# Patient Record
Sex: Female | Born: 1995 | Race: White | Hispanic: No | Smoking: Never smoker
Health system: Southern US, Community
[De-identification: ages and names within clinical notes are randomized; demographics above are authoritative.]

## PROBLEM LIST (undated history)

## (undated) DIAGNOSIS — J45909 Unspecified asthma, uncomplicated: Secondary | ICD-10-CM

## (undated) HISTORY — DX: Unspecified asthma, uncomplicated: J45.909

---
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
## 2016-10-12 ENCOUNTER — Ambulatory Visit: Payer: BLUE CROSS/BLUE SHIELD

## 2016-10-12 ENCOUNTER — Telehealth: Payer: BLUE CROSS/BLUE SHIELD

## 2016-10-12 MED ORDER — NORETHINDRONE ACET-ETHINYL EST 1-20 MG-MCG PO TABS
1 | ORAL_TABLET | Freq: Every day | ORAL | 11 refills | Status: AC
Start: 2016-10-12 — End: 2016-10-16

## 2016-10-16 ENCOUNTER — Ambulatory Visit: Payer: BLUE CROSS/BLUE SHIELD

## 2016-10-16 DIAGNOSIS — Z113 Encounter for screening for infections with a predominantly sexual mode of transmission: Secondary | ICD-10-CM

## 2016-10-16 DIAGNOSIS — Z0000 Encounter for general adult medical examination without abnormal findings: Secondary | ICD-10-CM

## 2017-02-15 ENCOUNTER — Emergency Department: Payer: BLUE CROSS/BLUE SHIELD

## 2017-02-15 ENCOUNTER — Encounter: Payer: Self-pay | Admitting: Emergency Medicine

## 2017-02-15 ENCOUNTER — Emergency Department
Admission: EM | Admit: 2017-02-15 | Discharge: 2017-02-15 | Disposition: A | Discharge status: Home / Self Care | Payer: BLUE CROSS/BLUE SHIELD | Attending: Emergency Medicine | Admitting: Emergency Medicine

## 2017-02-15 DIAGNOSIS — R1011 Right upper quadrant pain: Secondary | ICD-10-CM | POA: Diagnosis present

## 2017-02-15 DIAGNOSIS — N10 Acute tubulo-interstitial nephritis: Secondary | ICD-10-CM | POA: Diagnosis not present

## 2017-02-15 DIAGNOSIS — R1110 Vomiting, unspecified: Secondary | ICD-10-CM | POA: Diagnosis not present

## 2017-02-15 LAB — COMPREHENSIVE METABOLIC PANEL
ALT: 19 U/L (ref 14–54)
AST: 18 U/L (ref 15–41)
Albumin: 3.4 g/dL — ABNORMAL LOW (ref 3.5–5.0)
Alkaline Phosphatase: 76 U/L (ref 38–126)
Anion gap: 8 (ref 5–15)
BILIRUBIN TOTAL: 0.3 mg/dL (ref 0.3–1.2)
BUN: 8 mg/dL (ref 6–20)
CHLORIDE: 105 mmol/L (ref 101–111)
CO2: 25 mmol/L (ref 22–32)
CREATININE: 0.61 mg/dL (ref 0.44–1.00)
Calcium: 8.6 mg/dL — ABNORMAL LOW (ref 8.9–10.3)
GFR calc Af Amer: >60 mL/min (ref >60)
Glucose, Bld: 111 mg/dL — ABNORMAL HIGH (ref 65–99)
Potassium: 3.6 mmol/L (ref 3.5–5.1)
Sodium: 138 mmol/L (ref 135–145)
TOTAL PROTEIN: 7.2 g/dL (ref 6.5–8.1)

## 2017-02-15 LAB — URINALYSIS, COMPLETE (UACMP) WITH MICROSCOPIC
BILIRUBIN URINE: NEGATIVE
Glucose, UA: NEGATIVE mg/dL
Ketones, ur: NEGATIVE mg/dL
LEUKOCYTES UA: NEGATIVE
Nitrite: NEGATIVE
PH: 6 (ref 5.0–8.0)
Protein, ur: 30 mg/dL — AB
SPECIFIC GRAVITY, URINE: 1.018 (ref 1.005–1.030)

## 2017-02-15 LAB — POCT PREGNANCY, URINE: Preg Test, Ur: NEGATIVE

## 2017-02-15 LAB — CBC
HCT: 36.6 % (ref 35.0–47.0)
Hemoglobin: 12.5 g/dL (ref 12.0–16.0)
MCH: 28.9 pg (ref 26.0–34.0)
MCHC: 34 g/dL (ref 32.0–36.0)
MCV: 85 fL (ref 80.0–100.0)
PLATELETS: 238 10*3/uL (ref 150–440)
RBC: 4.31 MIL/uL (ref 3.80–5.20)
RDW: 13 % (ref 11.5–14.5)
WBC: 9.1 10*3/uL (ref 3.6–11.0)

## 2017-02-15 LAB — MONONUCLEOSIS SCREEN: MONO SCREEN: NEGATIVE

## 2017-02-15 LAB — LIPASE, BLOOD: Lipase: 26 U/L (ref 11–51)

## 2017-02-15 MED ORDER — CEPHALEXIN 500 MG PO CAPS: 500.0000 mg | ORAL_CAPSULE | Freq: Two times a day (BID) | ORAL | 0 refills | Status: AC

## 2017-02-15 MED ORDER — SODIUM CHLORIDE 0.9 % IV BOLUS (SEPSIS)
1000.0000 mL | Freq: Once | INTRAVENOUS | Status: AC
  Administered 2017-02-15: 1000 mL via INTRAVENOUS

## 2017-02-15 MED ORDER — CEPHALEXIN 500 MG PO CAPS
500.0000 mg | ORAL_CAPSULE | Freq: Once | ORAL | Status: AC
  Administered 2017-02-15: 500 mg via ORAL
  Filled 2017-02-15: qty 1

## 2017-02-15 NOTE — ED Notes (Signed)
Pt went to [facility]

## 2017-02-15 NOTE — ED Provider Notes (Signed)
[facility] Emergency Department Provider Note    First MD Initiated Contact with Patient 02/15/17 [phone]     (approximate)  I have reviewed the triage vital signs and the nursing notes.   HISTORY  Chief Complaint Abdominal Pain   HPI Darlene Ho is a 21 y.o. female presents to the emergency department right flank pain radiating to the right upper abdomen intermittent times one year but has worsened over the last week. Patient denies any nausea vomiting or diarrhea. Patient denies any urinary symptoms.   Past medical history None There are no active problems to display for this patient.   past surgical history None  Prior to Admission medications   Medication Sig Start Date End Date Taking? Authorizing Provider  cephALEXin (KEFLEX) 500 MG capsule Take 1 capsule (500 mg total) by mouth 2 (two) times daily. 02/15/17 02/25/17  Darci Current, MD    Allergies no known drug allergies  History reviewed. No pertinent family history.  Social History Social History  Substance Use Topics  . Smoking status: Never Smoker  . Smokeless tobacco: Never Used  . Alcohol use Yes    Review of Systems Constitutional: No fever/chills Eyes: No visual changes. ENT: No sore throat. Cardiovascular: Denies chest pain. Respiratory: Denies shortness of breath. Gastrointestinal: positive for right flank/abdomen pain. No nausea vomiting or diarrhea Genitourinary: Negative for dysuria. Musculoskeletal: Negative for neck pain.  Negative for back pain. Integumentary: Negative for rash. Neurological: Negative for headaches, focal weakness or numbness.  ____________________________________________   PHYSICAL EXAM:  VITAL SIGNS: ED Triage Vitals  Enc Vitals Group     BP 02/15/17 0422 116/82     Pulse Rate 02/15/17 0422 (!) 110     Resp 02/15/17 0422 17     Temp 02/15/17 0422 98.8 F (37.1 C)     Temp Source 02/15/17 0422 Oral     SpO2 02/15/17 0422  98 %     Weight 02/15/17 0418 59 kg (130 lb)     Height 02/15/17 0418 1.626 m (5\' 4" )     Head Circumference --      Peak Flow --      Pain Score 02/15/17 0445 2     Pain Loc --      Pain Edu? --      Excl. in GC? --     Constitutional: Alert and oriented. Well appearing and in no acute distress. Eyes: Conjunctivae are normal.  Head: Atraumatic. Mouth/Throat: Mucous membranes are moist.  Oropharynx non-erythematous. Neck: No stridor.  Cardiovascular: Normal rate, regular rhythm. Good peripheral circulation. Grossly normal heart sounds. Respiratory: Normal respiratory effort.  No retractions. Lungs CTAB. Gastrointestinal: Soft and nontender. No distention. positive right CVA tenderness Musculoskeletal: No lower extremity tenderness nor edema. No gross deformities of extremities. Neurologic:  Normal speech and language. No gross focal neurologic deficits are appreciated.  Skin:  Skin is warm, dry and intact. No rash noted. Psychiatric: Mood and affect are normal. Speech and behavior are normal.  ____________________________________________   LABS (all labs ordered are listed, but only abnormal results are displayed)  Labs Reviewed  COMPREHENSIVE METABOLIC PANEL - Abnormal; Notable for the following:       Result Value   Glucose, Bld 111 (*)    Calcium 8.6 (*)    Albumin 3.4 (*)    All other components within normal limits  URINALYSIS, COMPLETE (UACMP) WITH MICROSCOPIC - Abnormal; Notable for the following:    Color, Urine YELLOW (*)  APPearance CLEAR (*)    Hgb urine dipstick SMALL (*)    Protein, ur 30 (*)    Bacteria, UA RARE (*)    Squamous Epithelial / LPF 0-5 (*)    All other components within normal limits  LIPASE, BLOOD  CBC  MONONUCLEOSIS SCREEN  POC URINE PREG, ED  POCT PREGNANCY, URINE   __________ RADIOLOGY I, Monon N Haider Hornaday, personally viewed and evaluated these images (plain radiographs) as part of my medical decision making, as well as reviewing the  written report by the radiologist.  Ct Renal Stone Study  Result Date: 02/15/2017 CLINICAL DATA:  Acute on chronic right-sided back pain, radiating to the right flank and right side of the abdomen. EXAM: CT ABDOMEN AND PELVIS WITHOUT CONTRAST TECHNIQUE: Multidetector CT imaging of the abdomen and pelvis was performed following the standard protocol without IV contrast. COMPARISON:  Right upper quadrant ultrasound performed earlier today at 5:28 a.m. FINDINGS: Lower chest: Minimal bibasilar atelectasis is noted. The visualized portions of the mediastinum are unremarkable. Hepatobiliary: The liver is unremarkable in appearance. The gallbladder is unremarkable in appearance. The common bile duct remains normal in caliber. Pancreas: The pancreas is within normal limits. Spleen: The spleen is unremarkable in appearance. Adrenals/Urinary Tract: The adrenal glands are unremarkable in appearance. There is mild soft tissue inflammation about the right kidney, raising concern for mild right-sided pyelonephritis. No hydronephrosis is seen. No renal or ureteral stones are identified. Stomach/Bowel: The stomach is unremarkable in appearance. The small bowel is within normal limits. The appendix is normal in caliber, without evidence of appendicitis. The colon is unremarkable in appearance. Vascular/Lymphatic: The abdominal aorta is unremarkable in appearance. The inferior vena cava is grossly unremarkable. No retroperitoneal lymphadenopathy is seen. No pelvic sidewall lymphadenopathy is identified. Reproductive: The bladder is mildly distended and grossly unremarkable. The uterus is unremarkable in appearance. The ovaries are relatively symmetric. No suspicious adnexal masses are seen. Other: No additional soft tissue abnormalities are seen. Musculoskeletal: No acute osseous abnormalities are identified. The visualized musculature is unremarkable in appearance. IMPRESSION: Mild soft tissue inflammation about the right  kidney raises concern for mild right-sided pyelonephritis. No evidence of hydronephrosis. Electronically Signed   By: Roanna RaiderJeffery  Chang M.D.   On: 02/15/2017 06:35   [place] Abdomen Limited Ruq  Result Date: 02/15/2017 CLINICAL DATA:  Intermittent RIGHT upper quadrant and RIGHT flank pain for 1 year, worsening for 1 week. EXAM: ULTRASOUND ABDOMEN LIMITED RIGHT UPPER QUADRANT COMPARISON:  None. FINDINGS: Gallbladder: No gallstones or wall thickening visualized. No sonographic Murphy sign noted by sonographer. Common bile duct: Diameter: 3 mm Liver: No focal lesion identified. Within normal limits in parenchymal echogenicity. Portal vein is patent on color Doppler imaging with normal direction of blood flow towards the liver. IMPRESSION: Normal RIGHT upper quadrant ultrasound. Electronically Signed   By: Awilda Metroourtnay  Bloomer M.D.   On: 02/15/2017 06:03     Procedures   ____________________________________________   INITIAL IMPRESSION / ASSESSMENT AND PLAN / ED COURSE  As part of my medical decision making, I reviewed the following data within the electronic MEDICAL RECORD NUMBER 21 year old female presents with above history and physical exam concerning for Gallstones, Kidney Stones, or Pyelonephritis. Lab data notable for concern for UTI Ct concerning for Pyelonephritis. Patient given Keflex ____________________________________________  FINAL CLINICAL IMPRESSION(S) / ED DIAGNOSES  Final diagnoses:  Vomiting  RUQ pain  Pyelonephritis, acute     MEDICATIONS GIVEN DURING THIS VISIT:  Medications  sodium chloride 0.9 % bolus 1,000 mL (  0 mLs Intravenous Stopped 02/15/17 0730)  cephALEXin (KEFLEX) capsule 500 mg (500 mg Oral Given 02/15/17 0731)     NEW OUTPATIENT MEDICATIONS STARTED DURING THIS VISIT:  Discharge Medication List as of 02/15/2017  7:22 AM    START taking these medications   Details  cephALEXin (KEFLEX) 500 MG capsule Take 1 capsule (500 mg total) by mouth 2 (two) times daily.,  Starting Fri 02/15/2017, Until Mon 02/25/2017, Print        Discharge Medication List as of 02/15/2017  7:22 AM      Discharge Medication List as of 02/15/2017  7:22 AM       Note:  This document was prepared using Dragon voice recognition software and may include unintentional dictation errors.    Darci Current, MD 02/15/17 [phone]

## 2017-02-15 NOTE — ED Triage Notes (Signed)
Pt c/o right side of back that radiate into right flank and side of abdomin. Pt reports she has had these symptoms intermittent over last year but has started to have the pain more frequent in last week. Pt denies N/V/D.

## 2017-02-15 NOTE — ED Notes (Signed)
Lab called to say Red top tube had hemolyzed; new Red top collected and sent to lab.

## 2017-03-12 ENCOUNTER — Ambulatory Visit: Payer: BLUE CROSS/BLUE SHIELD

## 2017-03-12 DIAGNOSIS — N10 Acute tubulo-interstitial nephritis: Secondary | ICD-10-CM

## 2017-03-12 DIAGNOSIS — Z23 Encounter for immunization: Secondary | ICD-10-CM

## 2017-03-12 MED ORDER — INFLUENZA VAC SPLIT HIGH-DOSE 0.5 ML IM SUSY
.5 mL | Freq: Once | INTRAMUSCULAR | 0 refills | Status: AC
Start: 2017-03-12 — End: ?

## 2017-03-12 NOTE — Progress Notes
SUBJECTIVE:  Chief Complaint   Patient presents with    Pyelonephritis       The patient presents for follow up follow up pyelonephritis.  Symptoms were waxing and waning x 2 months prior to presentation. Finally ended up needing to go to the ED. Was diagnosed with pyelonephritis. CTA KUB otherwise negative in the ED. Took keflex BID x 10 days. Chills/fever, right flank pain, dysuria has resoleved. Would like to check urine to be safe.     Will get flu shot today.     There is no problem list on file for this patient.      Current Outpatient Prescriptions   Medication Sig    norethindrone-ethinyl estradiol (MICROGESTIN 1/20) 1-20 mg-mcg tablet Take 1 tablet by mouth daily.     No current facility-administered medications for this visit.        No Known Allergies    Social History   Substance Use Topics    Smoking status: Never Smoker    Smokeless tobacco: Never Used    Alcohol use 1.2 oz/week     2 Standard drinks or equivalent per week      Comment: 2/week       A Review of Systems was performed and is significant for above. Otherwise the ROS is negative.    OBJECTIVE:  BP 125/83  ~ Pulse 72  ~ Temp 36.5 C (97.7 F) (Tympanic)  ~ Wt 126 lb (57.2 kg)  ~ BMI 21.29 kg/m     NAD  Clear OP  No LAD  Neck Supple   RRR  CTAB  SOFT NDNT - CVAT   No Edema   Normal Gait   Normal Mood/Affect      ASSESSMENT/PLAN    1. Need for immunization against influenza  - Influenza vaccine IM;   PF (age 126-35 months)    2. Acute pyelonephritis  - Resolved   - Discussed if any symptoms return, needs prompt eval      The above diagnosis, orders, and follow-up were discussed with the patient. The patient had all questions answered satisfactorily and understands this recommended plan of care.

## 2017-03-12 NOTE — Addendum Note
Addended by: Deveron FurlongRISTOBAL, Annabeth Tortora ROSE on: 03/12/2017 11:23 AM     Modules accepted: Orders

## 2017-04-01 MED ORDER — JUNEL 1/20 1-20 MG-MCG PO TABS
1 | ORAL_TABLET | Freq: Every day | ORAL | 3 refills | Status: AC
Start: 2017-04-01 — End: 2018-01-04

## 2017-06-04 ENCOUNTER — Telehealth: Payer: BLUE CROSS/BLUE SHIELD

## 2017-06-04 NOTE — Telephone Encounter
New Rx Request    MD: Dr. Lars Masson    Last seen by MD: 03/12/17    Reason for the request: Pt believes that she has a UTI and is wanting to start anti-biotics as soon as possible. Pt is in [place] and unable to come for an office visit. Pt states that she had a kidney infection last October which Dr. Lars Masson is aware of and wants to get this taken care of before it becomes a bigger issue. Please send to the [facility] on file.    Thank you.     Any Symptoms:  [x]  Yes  []  No      ? If yes, what symptoms are you experiencing:    o Duration of symptoms (how long):    o Have you taken medication for symptoms (OTC or Rx):      Is a particular medication being requested? UTI, anti-biotics    Was an appointment offered? Yes    Patient has been notified of the 24-48 hour turnaround time.

## 2017-06-06 NOTE — Telephone Encounter
Pt called and would like to let the dr know that she does not need this anymore and that she took care of this in other ways.

## 2017-06-06 NOTE — Telephone Encounter
Pt calling back to check the status of her request    Please send medication to    [facility] - [place], [place] - [address]    Please call pt to let her know when this is completed.     Thank you

## 2017-06-06 NOTE — Telephone Encounter
Patient calling regarding medication she wants for her UTI. Please send to [facility] on file.

## 2018-01-03 MED ORDER — NORETHINDRONE ACET-ETHINYL EST 1-20 MG-MCG PO TABS
1 | ORAL_TABLET | Freq: Every day | ORAL | 3 refills | Status: AC
Start: 2018-01-03 — End: ?

## 2018-06-03 IMAGING — US US ABDOMEN LIMITED
1 series · 14 of 25 positions shown · non-contrast
Comparison: None.

CLINICAL DATA: Intermittent RIGHT upper quadrant and RIGHT flank
pain for 1 year, worsening for 1 week.

EXAM:
ULTRASOUND ABDOMEN LIMITED RIGHT UPPER QUADRANT

[Series 1: us abdomen limited · 0.22mm/px · 14 of 36 slices shown]
[im 1/36]
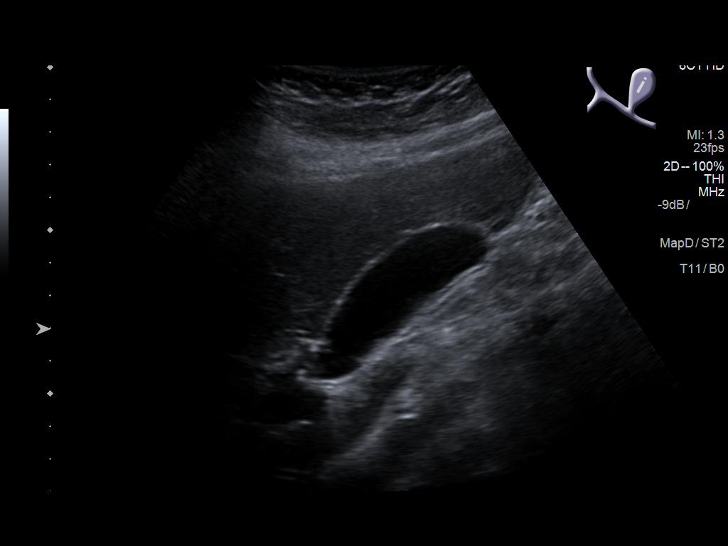
[im 3/36]
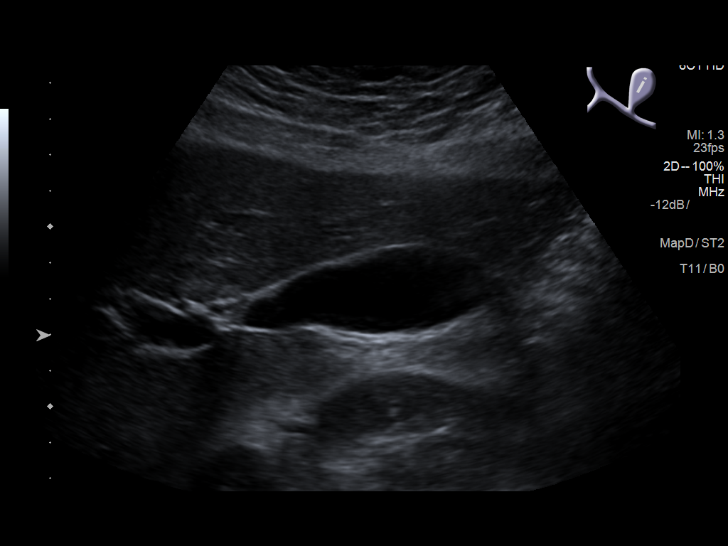
[im 6/36]
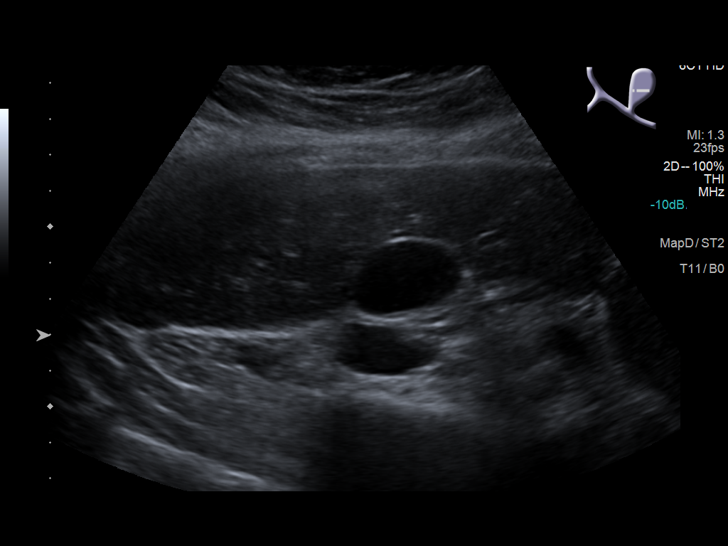
[im 9/36]
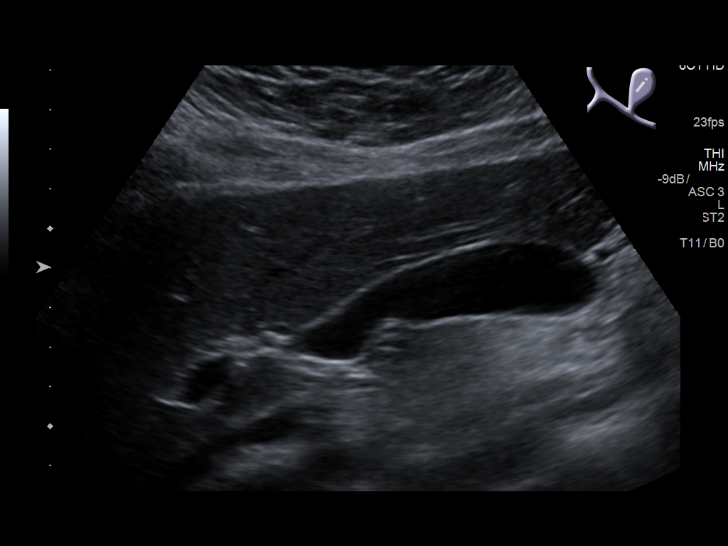
[im 12/36]
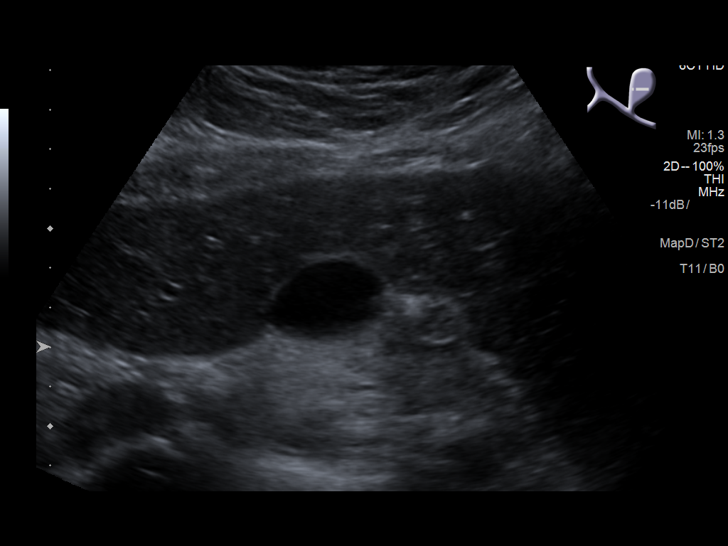
[im 14/36]
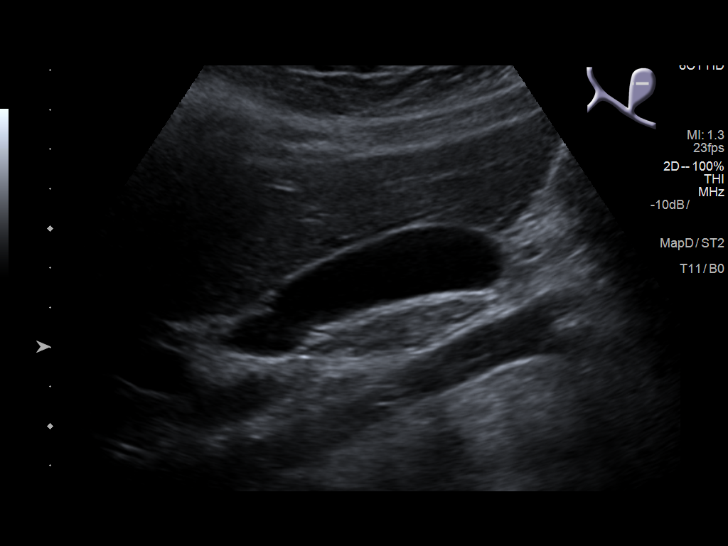
[im 17/36]
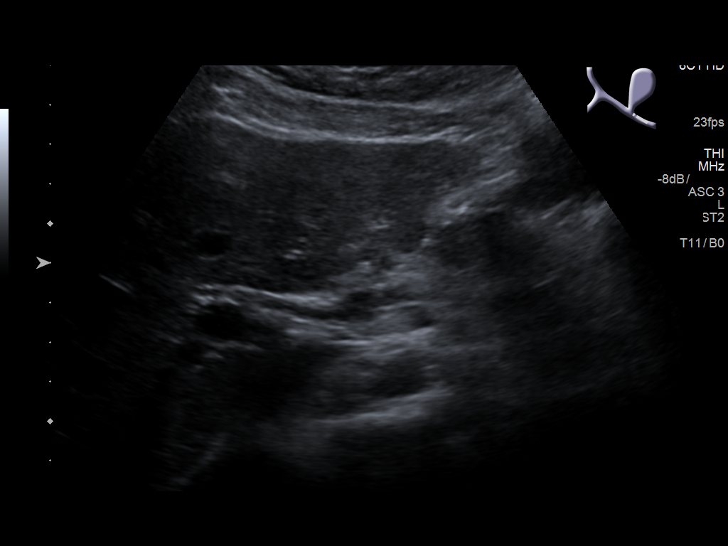
[im 19/36]
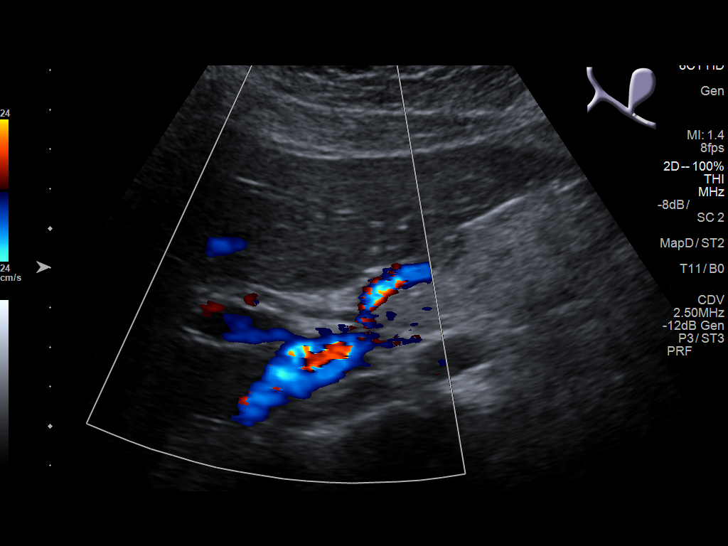
[im 22/36]
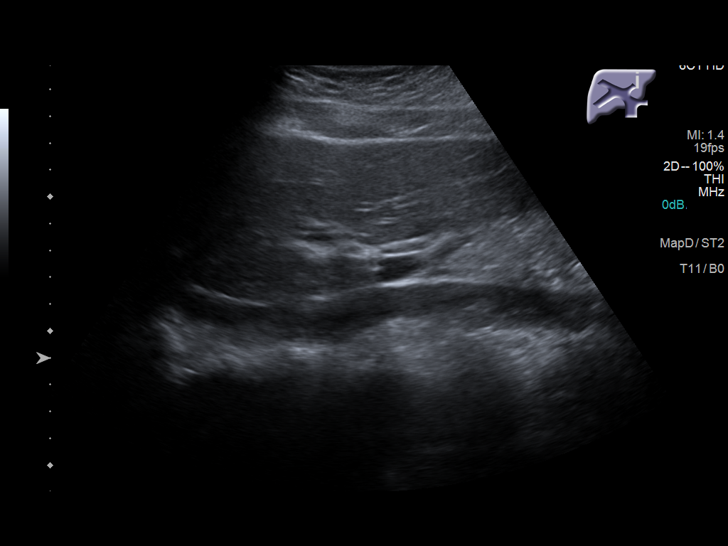
[im 24/36]
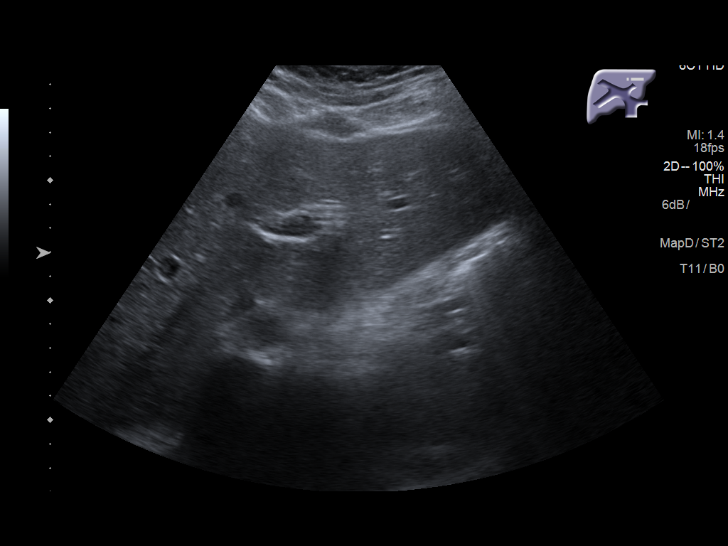
[im 27/36]
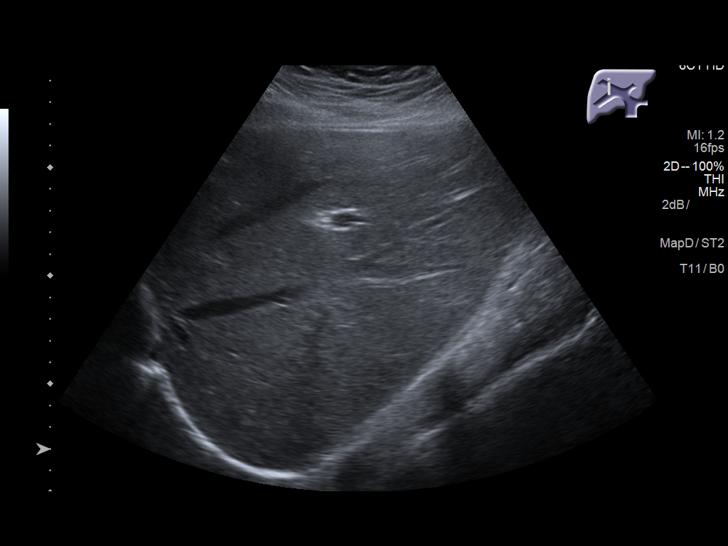
[im 30/36]
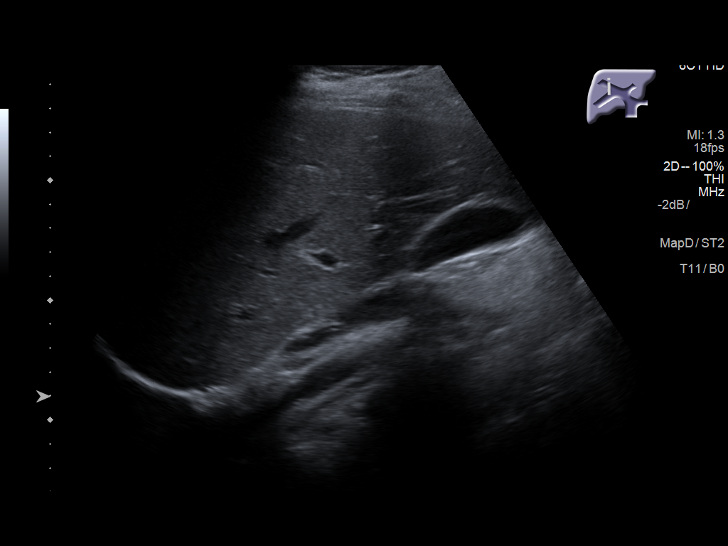
[im 33/36]
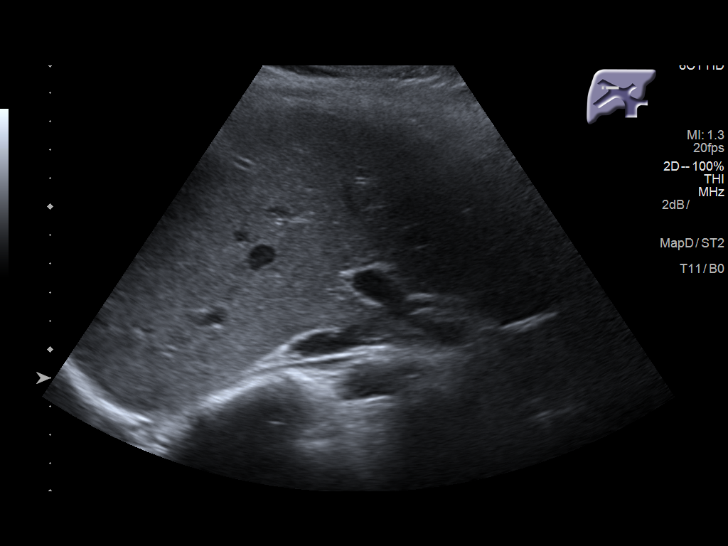
[im 36/36]
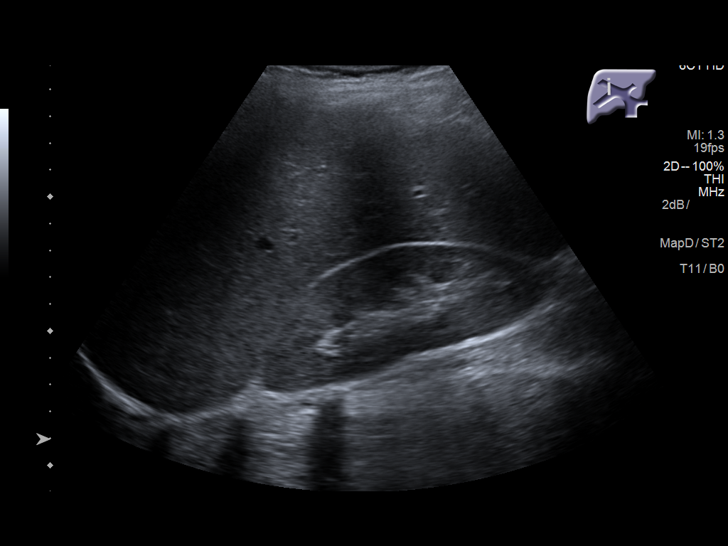

[14 of 25 positions shown; findings below may reference images not displayed]

FINDINGS: Gallbladder:

No gallstones or wall thickening visualized. No sonographic Murphy
sign noted by sonographer.

Common bile duct:

Diameter: 3 mm

Liver:

No focal lesion identified. Within normal limits in parenchymal
echogenicity. Portal vein is patent on color Doppler imaging with
normal direction of blood flow towards the liver.
IMPRESSION: Normal RIGHT upper quadrant ultrasound.

## 2018-06-11 ENCOUNTER — Encounter: Payer: Self-pay | Admitting: Family Medicine

## 2018-06-11 ENCOUNTER — Ambulatory Visit (INDEPENDENT_AMBULATORY_CARE_PROVIDER_SITE_OTHER): Payer: BLUE CROSS/BLUE SHIELD | Admitting: Family Medicine

## 2018-06-11 VITALS — BP 125/79 | HR 75 | Temp 98.2°F | Ht 64.5 in | Wt 136.2 lb

## 2018-06-11 DIAGNOSIS — L700 Acne vulgaris: Secondary | ICD-10-CM

## 2018-06-11 DIAGNOSIS — J4520 Mild intermittent asthma, uncomplicated: Secondary | ICD-10-CM | POA: Diagnosis not present

## 2018-06-11 DIAGNOSIS — N926 Irregular menstruation, unspecified: Secondary | ICD-10-CM | POA: Diagnosis not present

## 2018-06-11 NOTE — Progress Notes (Signed)
Patient: Darlene Ho, Female    DOB: 05-08-1995, 23 y.o.   MRN: [id number] Visit Date: 06/11/2018  Today's Provider: Shirlee Latch, MD   Chief Complaint  Patient presents with  . Establish Care   Subjective:    I, Presley Raddle, CMA, am acting as a scribe for Shirlee Latch, MD.    New Patient:  Darlene Ho is a 23 y.o. female who presents today to Establish Care. Patient's last PCP was in [place].  She feels well. She reports exercising at least 3 times per week. She reports she is sleeping fair.  Wants to get hormones tested and is worried that estrogen level is high. She continues to have inflammatory acne.  She is not currently on treatment for her acne.  Taking continuous OCPs for dysmenorrhea and  Menorrhagia. Periods were very irregular and would last up to 14 days. Now only has a period q6-8 months on OCPs when she chooses to.  She is sexually active with 1 female partner within the last 6 month and no h/o STDs.  She has read about PCOS and is worried about it.  She has never been diagnosed with this.    Asthma: Well controlled.  Not on medications.  Has taken Breo in the past.  Last flare ~5 years ago. -----------------------------------------------------------------   Review of Systems  Constitutional: Positive for unexpected weight change.  HENT: Positive for congestion.   Eyes: Negative.   Respiratory: Negative.   Cardiovascular: Negative.   Gastrointestinal: Positive for abdominal distention.  Endocrine: Negative.   Genitourinary: Negative.   Musculoskeletal: Positive for back pain, neck pain and neck stiffness.  Skin: Negative.   Allergic/Immunologic: Positive for environmental allergies.  Neurological: Negative.   Hematological: Negative.   Psychiatric/Behavioral: Positive for sleep disturbance.    Social History      She  reports that she has never smoked. She has never used smokeless tobacco. She reports current alcohol use of  about 2.0 standard drinks of alcohol per week. She reports that she does not use drugs.       Social History   Socioeconomic History  . Marital status: Significant Other    Spouse name: Not on file  . Number of children: Not on file  . Years of education: Not on file  . Highest education level: Not on file  Occupational History  . Not on file  Social Needs  . Financial resource strain: Not on file  . Food insecurity:    Worry: Not on file    Inability: Not on file  . Transportation needs:    Medical: Not on file    Non-medical: Not on file  Tobacco Use  . Smoking status: Never Smoker  . Smokeless tobacco: Never Used  Substance and Sexual Activity  . Alcohol use: Yes    Alcohol/week: 2.0 standard drinks    Types: 2 Glasses of wine per week  . Drug use: No  . Sexual activity: Yes    Birth control/protection: Pill  Lifestyle  . Physical activity:    Days per week: Not on file    Minutes per session: Not on file  . Stress: Not on file  Relationships  . Social connections:    Talks on phone: Not on file    Gets together: Not on file    Attends religious service: Not on file    Active member of club or organization: Not on file    Attends meetings of clubs  or organizations: Not on file    Relationship status: Not on file  Other Topics Concern  . Not on file  Social History Narrative  . Not on file    Past Medical History:  Diagnosis Date  . Asthma      There are no active problems to display for this patient.   Past Surgical History:  Procedure Laterality Date  . COSMETIC SURGERY  2015   nose     Family History        Family Status  Relation Name Status  . Mother  Alive  . MGM  Deceased  . PGF  Deceased  . Father  Alive  . MGF  Deceased  . PGM  Deceased        Her family history includes Cancer in her maternal grandmother and paternal grandfather; Diabetes in her mother.      No Known Allergies   Current Outpatient Medications:  .   norethindrone-ethinyl estradiol (MICROGESTIN,JUNEL,LOESTRIN) 1-20 MG-MCG tablet, Take by mouth., Disp: , Rfl:    Patient Care Team: Erasmo Downer, MD as PCP - General (Family Medicine)    Objective:    Vitals: BP 125/79 (BP Location: Right Arm, Patient Position: Sitting, Cuff Size: Normal)   Pulse 75   Temp 98.2 F (36.8 C) (Oral)   Ht 5' 4.5" (1.638 m)   Wt 136 lb 3.2 oz (61.8 kg)   SpO2 99%   BMI 23.02 kg/m    Vitals:   06/11/18 1341  BP: 125/79  Pulse: 75  Temp: 98.2 F (36.8 C)  TempSrc: Oral  SpO2: 99%  Weight: 136 lb 3.2 oz (61.8 kg)  Height: 5' 4.5" (1.638 m)     Physical Exam Vitals signs reviewed.  Constitutional:      General: She is not in acute distress.    Appearance: Normal appearance. She is well-developed. She is not diaphoretic.  HENT:     Head: Normocephalic and atraumatic.     Right Ear: Tympanic membrane, ear canal and external ear normal.     Left Ear: Tympanic membrane, ear canal and external ear normal.     Nose: Nose normal. No congestion.     Mouth/Throat:     Mouth: Mucous membranes are moist.     Pharynx: Oropharynx is clear. No oropharyngeal exudate.  Eyes:     General: No scleral icterus.    Conjunctiva/sclera: Conjunctivae normal.     Pupils: Pupils are equal, round, and reactive to light.  Neck:     Musculoskeletal: Neck supple.     Thyroid: No thyromegaly.  Cardiovascular:     Rate and Rhythm: Normal rate and regular rhythm.     Pulses: Normal pulses.     Heart sounds: Normal heart sounds. No murmur.  Pulmonary:     Effort: Pulmonary effort is normal. No respiratory distress.     Breath sounds: Normal breath sounds. No wheezing or rales.  Abdominal:     General: Bowel sounds are normal. There is no distension.     Palpations: Abdomen is soft.     Tenderness: There is no abdominal tenderness. There is no guarding or rebound.  Musculoskeletal:        General: No deformity.     Right lower leg: No edema.     Left  lower leg: No edema.  Lymphadenopathy:     Cervical: No cervical adenopathy.  Skin:    General: Skin is warm and dry.     Capillary Refill: Capillary  refill takes less than 2 seconds.     Findings: No rash.  Neurological:     Mental Status: She is alert and oriented to person, place, and time. Mental status is at baseline.     Sensory: No sensory deficit.     Motor: No weakness.     Gait: Gait normal.  Psychiatric:        Mood and Affect: Mood normal.        Behavior: Behavior normal.        Thought Content: Thought content normal.    Depression Screen PHQ 2/9 Scores 06/11/2018  PHQ - 2 Score 0  PHQ- 9 Score 3      Assessment & Plan:     Establish Care  Exercise Activities and Dietary recommendations Goals   None      There is no immunization history on file for this patient.  Health Maintenance  Topic Date Due  . HIV Screening  01/31/2012  . TETANUS/TDAP  01/31/2016  . INFLUENZA VACCINE  11/21/2017  . PAP-Cervical Cytology Screening  01/30/2018  . PAP SMEAR-Modifier  01/30/2018     Discussed health benefits of physical activity, and encouraged her to engage in regular exercise appropriate for her age and condition.    Patient to have medical records from [place] faxed to us including immunizations Plan to do 1st pap smear at upcoming CPE --------------------------------------------------------------------  Problem List Items Addressed This Visit      Respiratory   Mild intermittent asthma without complication    Well controlled Uncomplicated No medications Continue to monitor        Musculoskeletal and Integument   Acne vulgaris    Patient with moderate inflammatory acne Concern for PCOS in setting of irregular menses as well May try higher dose estrogen in OCPs/spironolactone PCOS labs as below      Relevant Medications   norethindrone-ethinyl estradiol (MICROGESTIN,JUNEL,LOESTRIN) 1-20 MG-MCG tablet   Other Relevant Orders   CBC  w/Diff/Platelet   Comprehensive metabolic panel   TSH   17-Hydroxyprogesterone   DHEA-sulfate   Follicle stimulating hormone   Luteinizing hormone   Prolactin   TestT+TestF+SHBG     Other   Irregular menses - Primary    Concern for possible PCOS in setting of DUB and acne Discussed this with patient She is very worried about fertility issues that could arise She was reassured Discussed that after checking labs, may consider higher dose of OCPs +/- spironolactone for acne      Relevant Orders   CBC w/Diff/Platelet   Comprehensive metabolic panel   TSH   17-Hydroxyprogesterone   DHEA-sulfate   Follicle stimulating hormone   Luteinizing hormone   Prolactin   TestT+TestF+SHBG       Return in about 4 weeks (around 07/09/2018) for CPE.   The entirety of the information documented in the History of Present Illness, Review of Systems and Physical Exam were personally obtained by me. Portions of this information were initially documented by Presley RaddleNikki Walston, CMA and reviewed by me for thoroughness and accuracy.    Erasmo DownerBacigalupo, Levelle Edelen M, MD, MPH [facility] 06/11/2018 4:59 PM

## 2018-06-11 NOTE — Patient Instructions (Signed)
Polycystic Ovarian Syndrome    Polycystic ovarian syndrome (PCOS) is a common hormonal disorder among women of reproductive age. In most women with PCOS, many small fluid-filled sacs (cysts) grow on the ovaries, and the cysts are not part of a normal menstrual cycle. PCOS can cause problems with your menstrual periods and make it difficult to get pregnant. It can also cause an increased risk of miscarriage with pregnancy. If it is not treated, PCOS can lead to serious health problems, such as diabetes and heart disease.  What are the causes?  The cause of PCOS is not known, but it may be the result of a combination of certain factors, such as:  · Irregular menstrual cycle.  · High levels of certain hormones (androgens).  · Problems with the hormone that helps to control blood sugar (insulin resistance).  · Certain genes.  What increases the risk?  This condition is more likely to develop in women who have a family history of PCOS.  What are the signs or symptoms?  Symptoms of PCOS may include:  · Multiple ovarian cysts.  · Infrequent periods or no periods.  · Periods that are too frequent or too heavy.  · Unpredictable periods.  · Inability to get pregnant (infertility) because of not ovulating.  · Increased growth of hair on the face, chest, stomach, back, thumbs, thighs, or toes.  · Acne or oily skin. Acne may develop during adulthood, and it may not respond to treatment.  · Pelvic pain.  · Weight gain or obesity.  · Patches of thickened and dark brown or black skin on the neck, arms, breasts, or thighs (acanthosis nigricans).  · Excess hair growth on the face, chest, abdomen, or upper thighs (hirsutism).  How is this diagnosed?  This condition is diagnosed based on:  · Your medical history.  · A physical exam, including a pelvic exam. Your health care provider may look for areas of increased hair growth on your skin.  · Tests, such as:  ? Ultrasound. This may be used to examine the ovaries and the lining of the  uterus (endometrium) for cysts.  ? Blood tests. These may be used to check levels of sugar (glucose), female hormone (testosterone), and female hormones (estrogen and progesterone) in your blood.  How is this treated?  There is no cure for PCOS, but treatment can help to manage symptoms and prevent more health problems from developing. Treatment varies depending on:  · Your symptoms.  · Whether you want to have a baby or whether you need birth control (contraception).  Treatment may include nutrition and lifestyle changes along with:  · Progesterone hormone to start a menstrual period.  · Birth control pills to help you have regular menstrual periods.  · Medicines to make you ovulate, if you want to get pregnant.  · Medicine to reduce excessive hair growth.  · Surgery, in severe cases. This may involve making small holes in one or both of your ovaries. This decreases the amount of testosterone that your body produces.  Follow these instructions at home:  · Take over-the-counter and prescription medicines only as told by your health care provider.  · Follow a healthy meal plan. This can help you reduce the effects of PCOS.  ? Eat a healthy diet that includes lean proteins, complex carbohydrates, fresh fruits and vegetables, low-fat dairy products, and healthy fats. Make sure to eat enough fiber.  · If you are overweight, lose weight as told by your health care   provider.  ? Losing 10% of your body weight may improve symptoms.  ? Your health care provider can determine how much weight loss is best for you and can help you lose weight safely.  · Keep all follow-up visits as told by your health care provider. This is important.  Contact a health care provider if:  · Your symptoms do not get better with medicine.  · You develop new symptoms.  This information is not intended to replace advice given to you by your health care provider. Make sure you discuss any questions you have with your health care provider.  Document  Released: 08/03/2004 Document Revised: 12/06/2015 Document Reviewed: 09/25/2015  Elsevier Interactive Patient Education © 2019 [employer] Inc[employer]

## 2018-06-11 NOTE — Assessment & Plan Note (Signed)
Well controlled Uncomplicated No medications Continue to monitor

## 2018-06-11 NOTE — Assessment & Plan Note (Signed)
Patient with moderate inflammatory acne Concern for PCOS in setting of irregular menses as well May try higher dose estrogen in OCPs/spironolactone PCOS labs as below

## 2018-06-11 NOTE — Assessment & Plan Note (Signed)
Concern for possible PCOS in setting of DUB and acne Discussed this with patient She is very worried about fertility issues that could arise She was reassured Discussed that after checking labs, may consider higher dose of OCPs +/- spironolactone for acne

## 2018-06-14 ENCOUNTER — Telehealth: Payer: Self-pay | Admitting: Family Medicine

## 2018-06-14 NOTE — Telephone Encounter (Signed)
Pt called for labs results that she had done on Wednesday  CB#  [phone]  Thanks [employer]

## 2018-06-16 ENCOUNTER — Telehealth: Payer: Self-pay

## 2018-06-16 NOTE — Telephone Encounter (Signed)
Patient was advised and would like to try a higher dose of OCPs. Patient states that she will be in to have labs redrawn.

## 2018-06-16 NOTE — Telephone Encounter (Signed)
-----   Message from Erasmo Downer, MD sent at 06/16/2018  9:56 AM EST ----- Normal labs.  No levels to suggest PCOS, but it is possible that these are corrected some by her OCPs.  I suggest we try a higher dose of OCPs to see if this helps with her acne.  Her liver function is very slightly elevated.  Recommend decreasing alcohol/tylenol intake and recheck in 1 month (no appt necessary, labs only).

## 2018-06-16 NOTE — Telephone Encounter (Signed)
Labs finalized today.  See Result note.

## 2018-06-17 MED ORDER — NORETHINDRONE ACET-ETHINYL EST 1.5-30 MG-MCG PO TABS: 1.0000 | ORAL_TABLET | Freq: Every day | ORAL | 5 refills | Status: DC

## 2018-06-17 NOTE — Telephone Encounter (Signed)
Rx sent 

## 2018-06-17 NOTE — Telephone Encounter (Signed)
Left message advising patient that RX has been sent to pharmacy.

## 2018-06-18 LAB — CBC WITH DIFFERENTIAL/PLATELET
BASOS ABS: 0 10*3/uL (ref 0.0–0.2)
Basos: 0 %
EOS (ABSOLUTE): 0.2 10*3/uL (ref 0.0–0.4)
Eos: 2 %
Hematocrit: 40.1 % (ref 34.0–46.6)
Hemoglobin: 13.4 g/dL (ref 11.1–15.9)
IMMATURE GRANULOCYTES: 0 %
Immature Grans (Abs): 0 10*3/uL (ref 0.0–0.1)
Lymphocytes Absolute: 3 10*3/uL (ref 0.7–3.1)
Lymphs: 32 %
MCH: 29 pg (ref 26.6–33.0)
MCHC: 33.4 g/dL (ref 31.5–35.7)
MCV: 87 fL (ref 79–97)
MONOS ABS: 0.5 10*3/uL (ref 0.1–0.9)
Monocytes: 5 %
NEUTROS PCT: 61 %
Neutrophils Absolute: 5.6 10*3/uL (ref 1.4–7.0)
PLATELETS: 370 10*3/uL (ref 150–450)
RBC: 4.62 x10E6/uL (ref 3.77–5.28)
RDW: 12.9 % (ref 11.7–15.4)
WBC: 9.2 10*3/uL (ref 3.4–10.8)

## 2018-06-18 LAB — COMPREHENSIVE METABOLIC PANEL
ALBUMIN: 4.1 g/dL (ref 3.9–5.0)
ALK PHOS: 90 IU/L (ref 39–117)
ALT: 34 IU/L — ABNORMAL HIGH (ref 0–32)
AST: 18 IU/L (ref 0–40)
Albumin/Globulin Ratio: 1.4 (ref 1.2–2.2)
BUN / CREAT RATIO: 16 (ref 9–23)
BUN: 10 mg/dL (ref 6–20)
Bilirubin Total: <0.2 mg/dL (ref 0.0–1.2)
CHLORIDE: 101 mmol/L (ref 96–106)
CO2: 24 mmol/L (ref 20–29)
Calcium: 9.2 mg/dL (ref 8.7–10.2)
Creatinine, Ser: 0.64 mg/dL (ref 0.57–1.00)
GFR calc Af Amer: 148 mL/min/{1.73_m2} (ref >59)
GFR calc non Af Amer: 128 mL/min/{1.73_m2} (ref >59)
GLOBULIN, TOTAL: 3 g/dL (ref 1.5–4.5)
Glucose: 100 mg/dL — ABNORMAL HIGH (ref 65–99)
Potassium: 4 mmol/L (ref 3.5–5.2)
SODIUM: 140 mmol/L (ref 134–144)
Total Protein: 7.1 g/dL (ref 6.0–8.5)

## 2018-06-18 LAB — TSH: TSH: 2.78 u[IU]/mL (ref 0.450–4.500)

## 2018-06-18 LAB — TESTT+TESTF+SHBG
SEX HORMONE BINDING: 136.7 nmol/L — AB (ref 24.6–122.0)
TESTOSTERONE FREE: 3.5 pg/mL (ref 0.0–4.2)
Testosterone, Total, LC/MS: 20.9 ng/dL (ref 10.0–55.0)

## 2018-06-18 LAB — LUTEINIZING HORMONE: LH: 8.2 m[IU]/mL

## 2018-06-18 LAB — FOLLICLE STIMULATING HORMONE: FSH: 3.4 m[IU]/mL

## 2018-06-18 LAB — DHEA-SULFATE: DHEA-SO4: 163.6 ug/dL (ref 110.0–431.7)

## 2018-06-18 LAB — PROLACTIN: Prolactin: 22.7 ng/mL (ref 4.8–23.3)

## 2018-06-18 LAB — 17-HYDROXYPROGESTERONE: 17-Hydroxyprogesterone: 20 ng/dL

## 2018-08-19 ENCOUNTER — Encounter: Payer: BLUE CROSS/BLUE SHIELD | Admitting: Family Medicine

## 2018-08-24 IMAGING — CT CT RENAL STONE PROTOCOL
3 of 4 series · 7 of 46 positions shown, 13 images · non-contrast
Comparison: Right upper quadrant ultrasound performed earlier today
at [DATE] a.m.

CLINICAL DATA: Acute on chronic right-sided back pain, radiating to
the right flank and right side of the abdomen.

EXAM:
CT ABDOMEN AND PELVIS WITHOUT CONTRAST
TECHNIQUE: Multidetector CT imaging of the abdomen and pelvis was performed
following the standard protocol without IV contrast.

[Series 4: lung bases · axial · 0.60mm/px · z∈[-613,-573]mm · 3 of 16 slices shown, 7 images]
[im 4/16  soft-tissue]
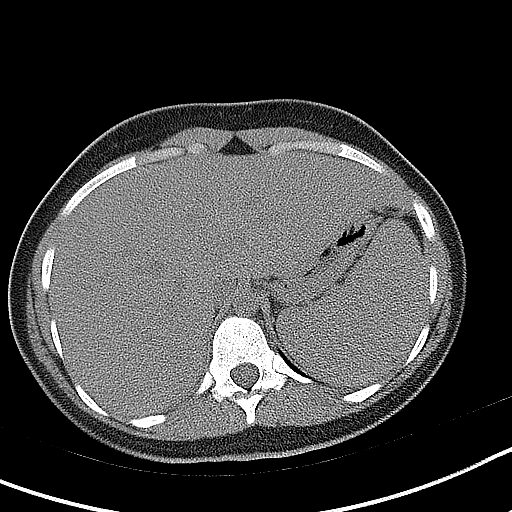
[im 4/16  lung]
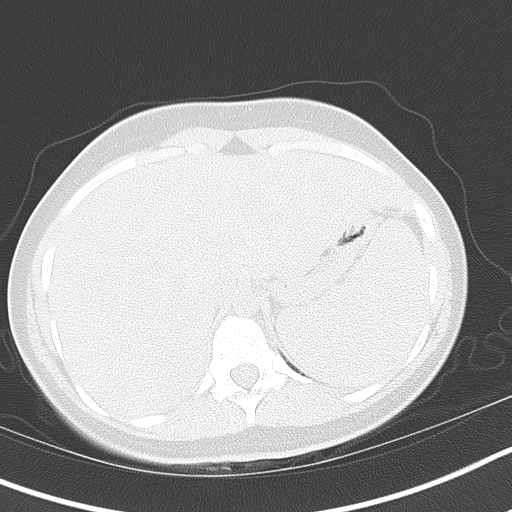
[im 4/16  bone]
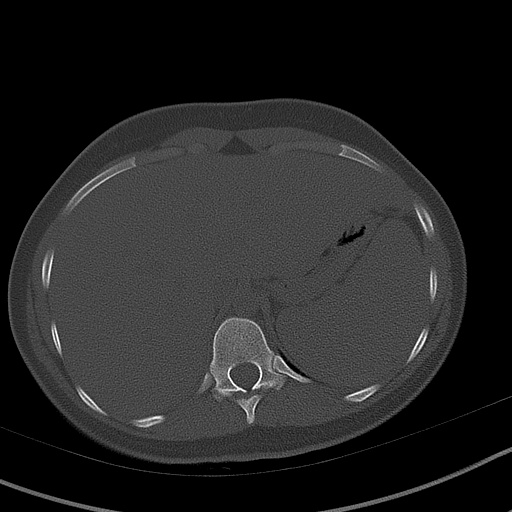
[im 8/16  soft-tissue]
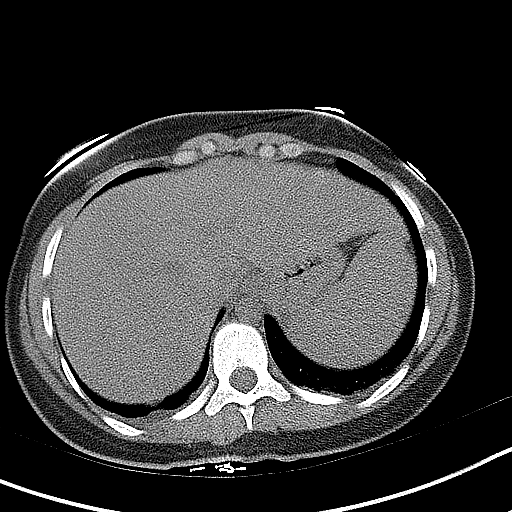
[im 8/16  lung]
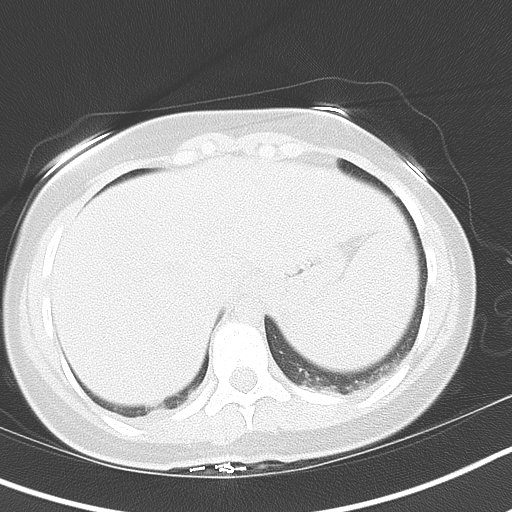
[im 12/16  soft-tissue]
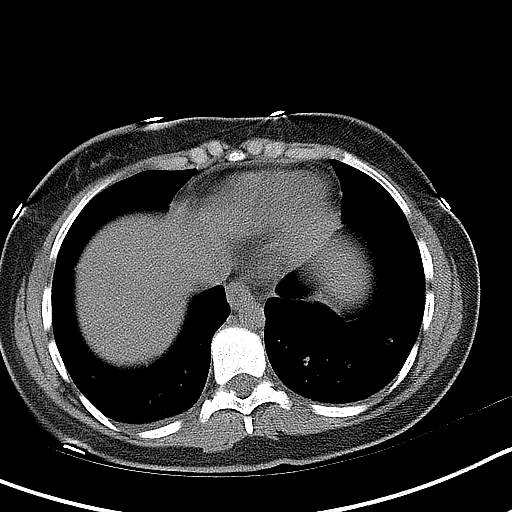
[im 12/16  lung]
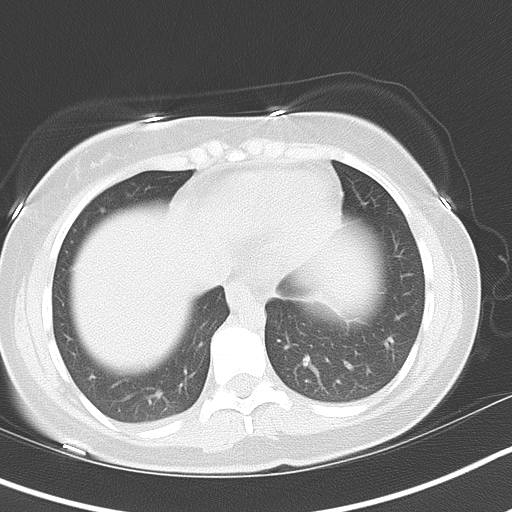

[Series 5: coronal · coronal · 0.62mm/px · 3 of 113 slices shown, 4 images]
[im 38/113  soft-tissue]
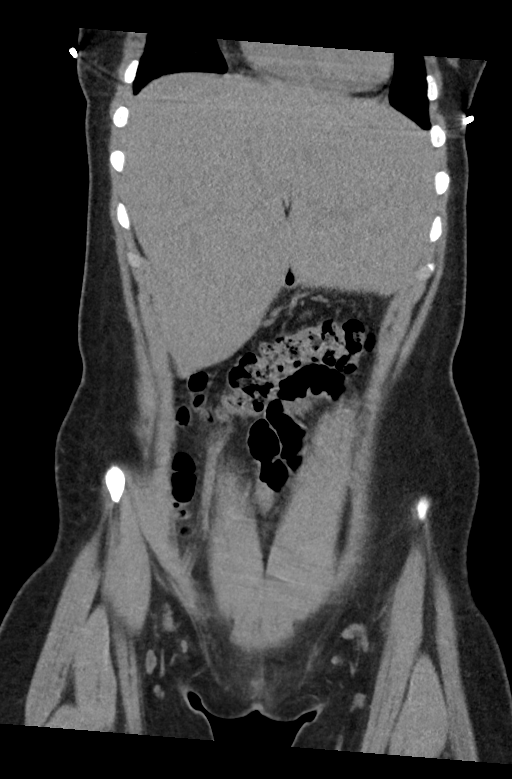
[im 50/113  soft-tissue]
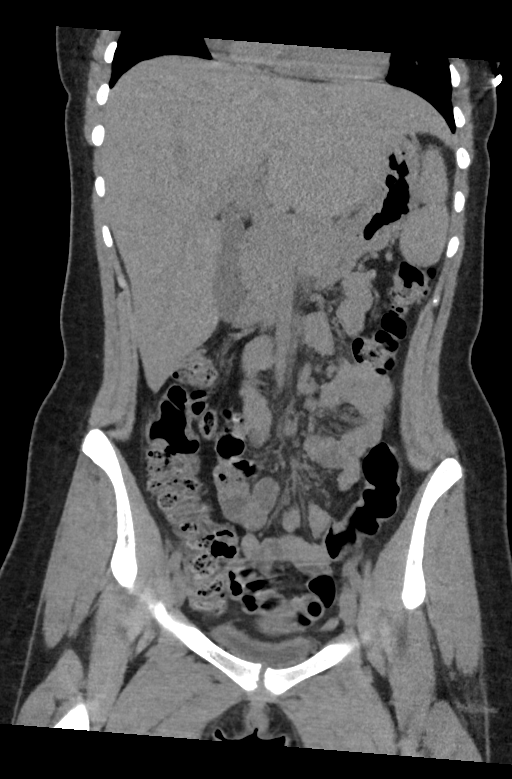
[im 50/113  bone]
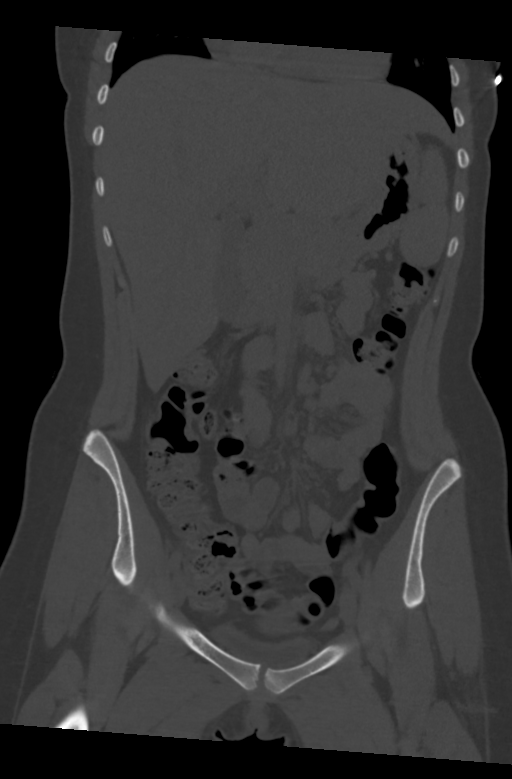
[im 63/113  soft-tissue]
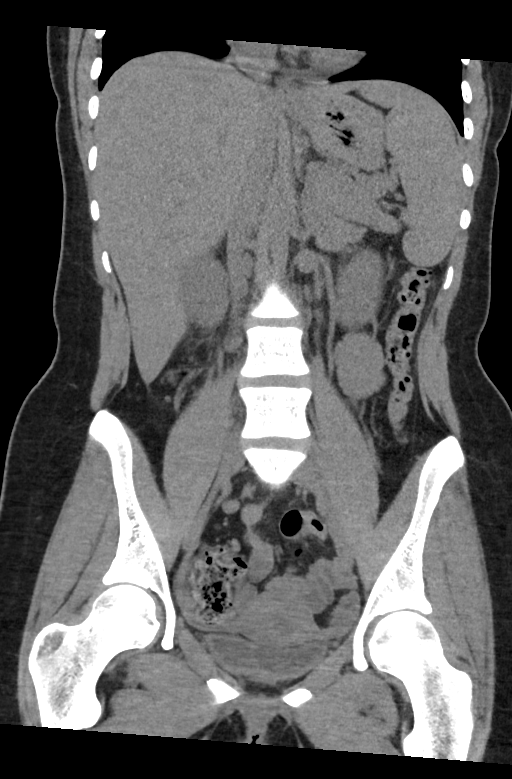

[Series 6: sagittal · sagittal · 0.54mm/px · 1 of 139 slices shown, 2 images]
[im 47/139  soft-tissue]
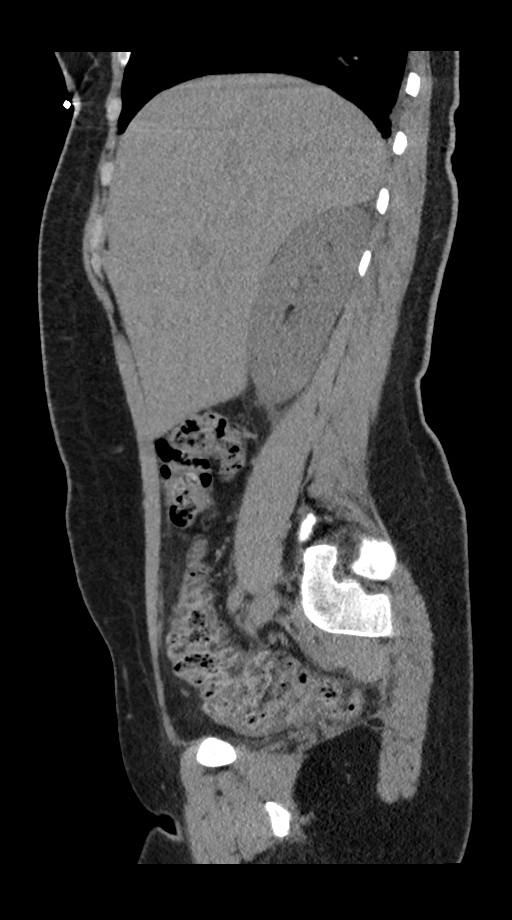
[im 47/139  bone]
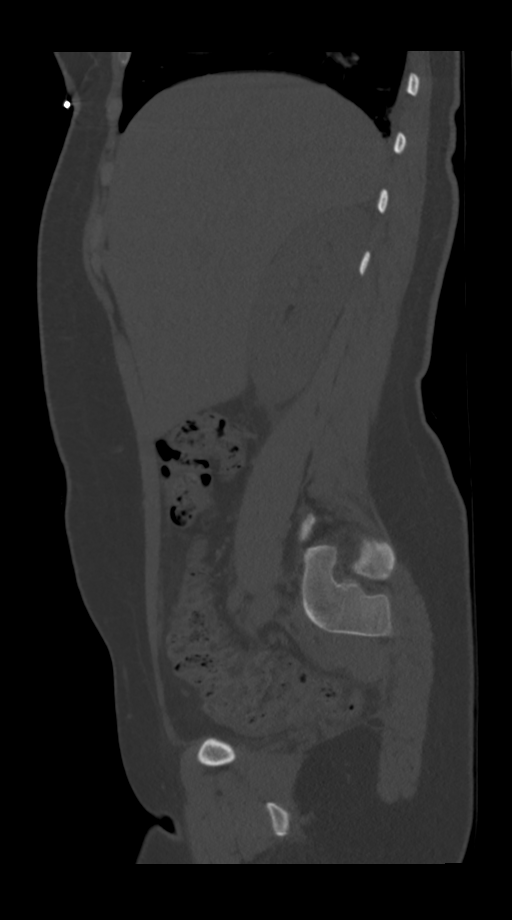

[7 of 46 positions shown; findings below may reference images not displayed]

FINDINGS: Lower chest: Minimal bibasilar atelectasis is noted. The visualized
portions of the mediastinum are unremarkable.

Hepatobiliary: The liver is unremarkable in appearance. The
gallbladder is unremarkable in appearance. The common bile duct
remains normal in caliber.

Pancreas: The pancreas is within normal limits.

Spleen: The spleen is unremarkable in appearance.

Adrenals/Urinary Tract: The adrenal glands are unremarkable in
appearance.

There is mild soft tissue inflammation about the right kidney,
raising concern for mild right-sided pyelonephritis. No
hydronephrosis is seen. No renal or ureteral stones are identified.

Stomach/Bowel: The stomach is unremarkable in appearance. The small
bowel is within normal limits. The appendix is normal in caliber,
without evidence of appendicitis. The colon is unremarkable in
appearance.

Vascular/Lymphatic: The abdominal aorta is unremarkable in
appearance. The inferior vena cava is grossly unremarkable. No
retroperitoneal lymphadenopathy is seen. No pelvic sidewall
lymphadenopathy is identified.

Reproductive: The bladder is mildly distended and grossly
unremarkable. The uterus is unremarkable in appearance. The ovaries
are relatively symmetric. No suspicious adnexal masses are seen.

Other: No additional soft tissue abnormalities are seen.

Musculoskeletal: No acute osseous abnormalities are identified. The
visualized musculature is unremarkable in appearance.
IMPRESSION: Mild soft tissue inflammation about the right kidney raises concern
for mild right-sided pyelonephritis. No evidence of hydronephrosis.

## 2018-10-17 ENCOUNTER — Telehealth: Payer: Self-pay

## 2018-10-17 NOTE — Telephone Encounter (Signed)
Patient calling that she is currently in [place] will be coming back until July 25th and is asking if there's a possibility to get tested for Covid-19 when she comes back. She doesn't have any symptoms.  I explained to patient that there are certain criteria for testing patient. Per patient just send the message to provider to let her know. She already tried [facility] and they told her she doesn't meet the criteria for testing.

## 2018-10-20 NOTE — Telephone Encounter (Signed)
Recommendations for testing change often.  By July 25th, testing could be different.  If she develops any symptoms, she should seek testing.  Otherwise, she can call us when she is back to discuss whether testing is recommended at that time.

## 2018-10-20 NOTE — Telephone Encounter (Signed)
LMOVM for pt to return call 

## 2018-10-20 NOTE — Telephone Encounter (Signed)
Pt returned call ° °teri °

## 2018-10-20 NOTE — Telephone Encounter (Signed)
Patient advised.

## 2018-11-26 ENCOUNTER — Telehealth: Payer: Self-pay

## 2018-11-26 ENCOUNTER — Other Ambulatory Visit: Payer: Self-pay

## 2018-11-26 DIAGNOSIS — Z20822 Contact with and (suspected) exposure to covid-19: Secondary | ICD-10-CM

## 2018-11-26 NOTE — Telephone Encounter (Signed)
We don't have ability to do rapid tests.  Our tests take 4-6 days to result.  She will need to quarantine until results available. We can go ahead and put order in for drive up testing if she'd like to do this.

## 2018-11-26 NOTE — Telephone Encounter (Signed)
Patient had a Covid exposure on November 20, 2018. She had a  friend test positive yesterday. 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 Please advise.

## 2018-11-26 NOTE — Telephone Encounter (Signed)
Patient notified. She will quarantine until her results are received.

## 2018-11-27 LAB — NOVEL CORONAVIRUS, NAA: SARS-CoV-2, NAA: NOT DETECTED

## 2018-11-28 ENCOUNTER — Encounter: Payer: BLUE CROSS/BLUE SHIELD | Admitting: Family Medicine

## 2018-12-12 ENCOUNTER — Encounter: Payer: Self-pay | Admitting: Family Medicine

## 2018-12-12 ENCOUNTER — Other Ambulatory Visit: Payer: Self-pay

## 2018-12-12 ENCOUNTER — Ambulatory Visit (INDEPENDENT_AMBULATORY_CARE_PROVIDER_SITE_OTHER): Payer: BC Managed Care – PPO | Admitting: Family Medicine

## 2018-12-12 ENCOUNTER — Other Ambulatory Visit (HOSPITAL_COMMUNITY)
Admission: RE | Admit: 2018-12-12 | Discharge: 2018-12-12 | Disposition: A | Discharge status: Home / Self Care | Payer: BC Managed Care – PPO | Source: Ambulatory Visit | Attending: Family Medicine | Admitting: Family Medicine

## 2018-12-12 VITALS — BP 118/72 | HR 62 | Temp 97.5°F | Resp 16 | Ht 65.0 in | Wt 128.0 lb

## 2018-12-12 DIAGNOSIS — Z0000 Encounter for general adult medical examination without abnormal findings: Secondary | ICD-10-CM | POA: Diagnosis not present

## 2018-12-12 DIAGNOSIS — L700 Acne vulgaris: Secondary | ICD-10-CM | POA: Diagnosis not present

## 2018-12-12 DIAGNOSIS — Z124 Encounter for screening for malignant neoplasm of cervix: Secondary | ICD-10-CM

## 2018-12-12 DIAGNOSIS — J4520 Mild intermittent asthma, uncomplicated: Secondary | ICD-10-CM

## 2018-12-12 DIAGNOSIS — N926 Irregular menstruation, unspecified: Secondary | ICD-10-CM | POA: Diagnosis not present

## 2018-12-12 NOTE — Assessment & Plan Note (Signed)
Fairly well controlled with OCPs Some acne around face mask

## 2018-12-12 NOTE — Assessment & Plan Note (Signed)
Labs ruled out PCOS Periods regular on OCPs

## 2018-12-12 NOTE — Assessment & Plan Note (Signed)
Well controlled Uncomplicated No meds Continue to monitor

## 2018-12-12 NOTE — Progress Notes (Signed)
Patient: Darlene Ho, Female    DOB: 28-Jun-1995, 23 y.o.   MRN: [id number] Visit Date: 12/12/2018  Today's Provider: Shirlee LatchAngela Annastyn Silvey, MD   Chief Complaint  Patient presents with  . Annual Exam   Subjective:     Annual physical exam Darlene Ho is a 23 y.o. female who presents today for health maintenance and complete physical. She feels well. She reports exercising daily. She reports she is sleeping well.  ----------------------------------------------------------------- She is a Surveyor, mineralspart-time student at [employer] with only 1 in person class and 1 online class.  Also works as as Social workertour guide  Review of Systems  Constitutional: Negative.   HENT: Negative.   Eyes: Negative.   Respiratory: Negative.   Cardiovascular: Negative.   Gastrointestinal: Negative.   Endocrine: Negative.   Genitourinary: Negative.   Musculoskeletal: Positive for back pain and neck pain.  Skin: Negative.   Allergic/Immunologic: Positive for environmental allergies.  Neurological: Negative.   Hematological: Negative.   Psychiatric/Behavioral: Negative.     Social History      She  reports that she has never smoked. She has never used smokeless tobacco. She reports current alcohol use of about 2.0 standard drinks of alcohol per week. She reports that she does not use drugs.       Social History   Socioeconomic History  . Marital status: Significant Other    Spouse name: Not on file  . Number of children: 0  . Years of education: Not on file  . Highest education level: Not on file  Occupational History  . Occupation: Scientist, product/process developmentJunior studying Psych  Social Needs  . Financial resource strain: Not on file  . Food insecurity    Worry: Not on file    Inability: Not on file  . Transportation needs    Medical: Not on file    Non-medical: Not on file  Tobacco Use  . Smoking status: Never Smoker  . Smokeless tobacco: Never Used  Substance and Sexual Activity  . Alcohol use: Yes   Alcohol/week: 2.0 standard drinks    Types: 2 Glasses of wine per week  . Drug use: No  . Sexual activity: Yes    Partners: Male    Birth control/protection: Pill  Lifestyle  . Physical activity    Days per week: Not on file    Minutes per session: Not on file  . Stress: Not on file  Relationships  . Social Musicianconnections    Talks on phone: Not on file    Gets together: Not on file    Attends religious service: Not on file    Active member of club or organization: Not on file    Attends meetings of clubs or organizations: Not on file    Relationship status: Not on file  Other Topics Concern  . Not on file  Social History Narrative  . Not on file    Past Medical History:  Diagnosis Date  . Asthma      Patient Active Problem List   Diagnosis Date Noted  . Irregular menses 06/11/2018  . Acne vulgaris 06/11/2018  . Mild intermittent asthma without complication 06/11/2018    Past Surgical History:  Procedure Laterality Date  . COSMETIC SURGERY  2015   nose   . TONSILLECTOMY  2003    Family History        Family Status  Relation Name Status  . Mother  Alive  . MGM  Deceased  . PGF  Deceased  . Father  Alive  . MGF  Deceased  . PGM  Deceased  . Neg Hx  (Not Specified)        Her family history includes Alzheimer's disease in her maternal grandfather; Breast cancer in her maternal grandmother; Diabetes in her mother; Diabetes Mellitus II in her paternal grandfather; Gout in her father; Obesity in her father. There is no history of Colon cancer or Ovarian cancer.      No Known Allergies   Current Outpatient Medications:  .  Norethindrone Acetate-Ethinyl Estradiol (JUNEL 1.5/30) 1.5-30 MG-MCG tablet, Take 1 tablet by mouth daily. Take active pills continuously for 3 months, Disp: 4 Package, Rfl: 5   Patient Care Team: Virginia Crews, MD as PCP - General (Family Medicine)    Objective:    Vitals: BP 118/72 (BP Location: Left Arm, Patient Position:  Sitting, Cuff Size: Normal)   Pulse 62   Temp (!) 97.5 F (36.4 C) (Temporal)   Resp 16   Ht 5\' 5"  (1.651 m)   Wt 128 lb (58.1 kg)   BMI 21.30 kg/m    Vitals:   12/12/18 0828  BP: 118/72  Pulse: 62  Resp: 16  Temp: (!) 97.5 F (36.4 C)  TempSrc: Temporal  Weight: 128 lb (58.1 kg)  Height: 5\' 5"  (1.651 m)     Physical Exam Vitals signs reviewed.  Constitutional:      General: She is not in acute distress.    Appearance: Normal appearance. She is well-developed. She is not diaphoretic.  HENT:     Head: Normocephalic and atraumatic.     Right Ear: Tympanic membrane, ear canal and external ear normal.     Left Ear: Tympanic membrane, ear canal and external ear normal.  Eyes:     General: No scleral icterus.    Conjunctiva/sclera: Conjunctivae normal.     Pupils: Pupils are equal, round, and reactive to light.  Neck:     Musculoskeletal: Neck supple.     Thyroid: No thyromegaly.  Cardiovascular:     Rate and Rhythm: Normal rate and regular rhythm.     Pulses: Normal pulses.     Heart sounds: Normal heart sounds. No murmur.  Pulmonary:     Effort: Pulmonary effort is normal. No respiratory distress.     Breath sounds: Normal breath sounds. No wheezing or rales.  Abdominal:     General: There is no distension.     Palpations: Abdomen is soft.     Tenderness: There is no abdominal tenderness.  Genitourinary:    Comments: External genitalia within normal limits.  Vaginal mucosa pink, moist, normal rugae.  Nonfriable cervix without lesions, no discharge or bleeding noted on speculum exam.  Bimanual exam revealed normal, nongravid uterus.  No cervical motion tenderness. No adnexal masses bilaterally.    Musculoskeletal:        General: No deformity.     Right lower leg: No edema.     Left lower leg: No edema.  Lymphadenopathy:     Cervical: No cervical adenopathy.  Skin:    General: Skin is warm and dry.     Capillary Refill: Capillary refill takes less than 2 seconds.      Findings: No rash.  Neurological:     Mental Status: She is alert and oriented to person, place, and time.  Psychiatric:        Mood and Affect: Mood normal.        Behavior: Behavior normal.  Thought Content: Thought content normal.      Depression Screen PHQ 2/9 Scores 12/12/2018 06/11/2018  PHQ - 2 Score 0 0  PHQ- 9 Score 1 3      Assessment & Plan:     Routine Health Maintenance and Physical Exam  Exercise Activities and Dietary recommendations Goals   None      There is no immunization history on file for this patient.  Health Maintenance  Topic Date Due  . HIV Screening  01/31/2012  . TETANUS/TDAP  01/31/2016  . PAP-Cervical Cytology Screening  01/30/2018  . PAP SMEAR-Modifier  01/30/2018  . INFLUENZA VACCINE  07/22/2019 (Originally 11/22/2018)     Discussed health benefits of physical activity, and encouraged her to engage in regular exercise appropriate for her age and condition.    Patient to email shot record Will get flu shot at school  --------------------------------------------------------------------  Problem List Items Addressed This Visit      Respiratory   Mild intermittent asthma without complication    Well controlled Uncomplicated No meds Continue to monitor        Musculoskeletal and Integument   Acne vulgaris    Fairly well controlled with OCPs Some acne around face mask        Other   Irregular menses    Labs ruled out PCOS Periods regular on OCPs       Other Visit Diagnoses    Annual physical exam    -  Primary   Cervical cancer screening       Relevant Orders   Cytology - PAP       Return in about 1 year (around 12/12/2019) for CPE.   The entirety of the information documented in the History of Present Illness, Review of Systems and Physical Exam were personally obtained by me. Portions of this information were initially documented by Rondel BatonSulibeya Dimas, CMA and reviewed by me for thoroughness and  accuracy.    Kristain Filo, Marzella SchleinAngela M, MD MPH [facility]

## 2018-12-12 NOTE — Patient Instructions (Signed)
Preventive Care 18-21 Years Old, Female Preventive care refers to lifestyle choices and visits with your health care provider that can promote health and wellness. At this stage in your life, you may start seeing a primary care physician instead of a pediatrician. Your health care is now your responsibility. Preventive care for young adults includes:  A yearly physical exam. This is also called an annual wellness visit.  Regular dental and eye exams.  Immunizations.  Screening for certain conditions.  Healthy lifestyle choices, such as diet and exercise. What can I expect for my preventive care visit? Physical exam Your health care provider may check:  Height and weight. These may be used to calculate body mass index (BMI), which is a measurement that tells if you are at a healthy weight.  Heart rate and blood pressure.  Body temperature. Counseling Your health care provider may ask you questions about:  Past medical problems and family medical history.  Alcohol, tobacco, and drug use.  Home and relationship well-being.  Access to firearms.  Emotional well-being.  Diet, exercise, and sleep habits.  Sexual activity and sexual health.  Method of birth control.  Menstrual cycle.  Pregnancy history. What immunizations do I need?  Influenza (flu) vaccine  This is recommended every year. Tetanus, diphtheria, and pertussis (Tdap) vaccine  You may need a Td booster every 10 years. Varicella (chickenpox) vaccine  You may need this vaccine if you have not already been vaccinated. Human papillomavirus (HPV) vaccine  If recommended by your health care provider, you may need three doses over 6 months. Measles, mumps, and rubella (MMR) vaccine  You may need at least one dose of MMR. You may also need a second dose. Meningococcal conjugate (MenACWY) vaccine  One dose is recommended if you are 19-21 years old and a first-year college student living in a residence hall,  or if you have one of several medical conditions. You may also need additional booster doses. Pneumococcal conjugate (PCV13) vaccine  You may need this if you have certain conditions and were not previously vaccinated. Pneumococcal polysaccharide (PPSV23) vaccine  You may need one or two doses if you smoke cigarettes or if you have certain conditions. Hepatitis A vaccine  You may need this if you have certain conditions or if you travel or work in places where you may be exposed to hepatitis A. Hepatitis B vaccine  You may need this if you have certain conditions or if you travel or work in places where you may be exposed to hepatitis B. Haemophilus influenzae type b (Hib) vaccine  You may need this if you have certain risk factors. You may receive vaccines as individual doses or as more than one vaccine together in one shot (combination vaccines). Talk with your health care provider about the risks and benefits of combination vaccines. What tests do I need? Blood tests  Lipid and cholesterol levels. These may be checked every 5 years starting at age 20.  Hepatitis C test.  Hepatitis B test. Screening  Pelvic exam and Pap test. This may be done every 3 years starting at age 21.  Sexually transmitted disease (STD) testing, if you are at risk.  BRCA-related cancer screening. This may be done if you have a family history of breast, ovarian, tubal, or peritoneal cancers. Other tests  Tuberculosis skin test.  Vision and hearing tests.  Skin exam.  Breast exam. Follow these instructions at home: Eating and drinking   Eat a diet that includes fresh fruits and   vegetables, whole grains, lean protein, and low-fat dairy products.  Drink enough fluid to keep your urine pale yellow.  Do not drink alcohol if: ? Your health care provider tells you not to drink. ? You are pregnant, may be pregnant, or are planning to become pregnant. ? You are under the legal drinking age. In the  [place] the legal drinking age is 21.  If you drink alcohol: ? Limit how much you have to 0-1 drink a day. ? Be aware of how much alcohol is in your drink. In the [place] one drink equals one 12 oz bottle of beer (355 mL), one 5 oz glass of wine (148 mL), or one 1 oz glass of hard liquor (44 mL). Lifestyle  Take daily care of your teeth and gums.  Stay active. Exercise at least 30 minutes 5 or more days of the week.  Do not use any products that contain nicotine or tobacco, such as cigarettes, e-cigarettes, and chewing tobacco. If you need help quitting, ask your health care provider.  Do not use drugs.  If you are sexually active, practice safe sex. Use a condom or other form of birth control (contraception) in order to prevent pregnancy and STIs (sexually transmitted infections). If you plan to become pregnant, see your health care provider for a pre-conception visit.  Find healthy ways to cope with stress, such as: ? Meditation, yoga, or listening to music. ? Journaling. ? Talking to a trusted person. ? Spending time with friends and family. Safety  Always wear your seat belt while driving or riding in a vehicle.  Do not drive if you have been drinking alcohol. Do not ride with someone who has been drinking.  Do not drive when you are tired or distracted. Do not text while driving.  Wear a helmet and other protective equipment during sports activities.  If you have firearms in your house, make sure you follow all gun safety procedures.  Seek help if you have been bullied, physically abused, or sexually abused.  Use the Internet responsibly to avoid dangers such as online bullying and online sex predators. What's next?  Go to your health care provider once a year for a well check visit.  Ask your health care provider how often you should have your eyes and teeth checked.  Stay up to date on all vaccines. This information is not intended to replace advice given to you by  your health care provider. Make sure you discuss any questions you have with your health care provider. Document Released: 08/25/2015 Document Revised: 04/03/2018 Document Reviewed: 04/03/2018 Elsevier Patient Education  2020 Elsevier Inc.  

## 2018-12-15 ENCOUNTER — Telehealth: Payer: Self-pay

## 2018-12-15 LAB — CYTOLOGY - PAP
Chlamydia: NEGATIVE
Diagnosis: NEGATIVE
Neisseria Gonorrhea: NEGATIVE

## 2018-12-15 NOTE — Telephone Encounter (Signed)
-----   Message from Virginia Crews, MD sent at 12/15/2018  2:47 PM EDT ----- Normal pap smear. Repeat in 3 yrs.  Negative STD screening

## 2018-12-15 NOTE — Telephone Encounter (Signed)
Patient was advised.  

## 2019-01-01 ENCOUNTER — Other Ambulatory Visit: Payer: Self-pay

## 2019-01-01 ENCOUNTER — Ambulatory Visit (INDEPENDENT_AMBULATORY_CARE_PROVIDER_SITE_OTHER): Payer: BC Managed Care – PPO | Admitting: Family Medicine

## 2019-01-01 ENCOUNTER — Encounter: Payer: Self-pay | Admitting: Family Medicine

## 2019-01-01 VITALS — BP 111/63 | HR 80 | Temp 97.5°F | Wt 125.4 lb

## 2019-01-01 DIAGNOSIS — L700 Acne vulgaris: Secondary | ICD-10-CM

## 2019-01-01 NOTE — Progress Notes (Signed)
Patient: Darlene Ho Female    DOB: [birth year]   23 y.o.   MRN: [id number] Visit Date: 01/01/2019  Today's Provider: Lavon Paganini, MD   Chief Complaint  Patient presents with  . Acne   Subjective:    I, [name] CMA, am acting as a scribe for Lavon Paganini, MD.    HPI Patient presents today to discuss starting Accutane for acne. Patient states she has had acne since 5 grade and that she is currently using Junel but ir is not helping with her ance. Patient states that is is not getting worse but she I having bad outbreaks.  She has seen Derm in the past and tried "every topical" without success in the past. This includes Proactive, Rodan and Fields, Retinols, salicylic acid, benzoyl peroxide, antimicrobials. Has also tried PO Doxycycline  Skin routine: AM: Cleanser, Salicylic acid toner, and SPF moisturizer, PM: Cleanser, zit stick on problem areas, night cream  No Known Allergies   Current Outpatient Medications:  .  Norethindrone Acetate-Ethinyl Estradiol (JUNEL 1.5/30) 1.5-30 MG-MCG tablet, Take 1 tablet by mouth daily. Take active pills continuously for 3 months, Disp: 4 Package, Rfl: 5  Review of Systems  Constitutional: Negative.   Respiratory: Negative.   Cardiovascular: Negative.   Musculoskeletal: Negative.   Skin:       Acne     Social History   Tobacco Use  . Smoking status: Never Smoker  . Smokeless tobacco: Never Used  Substance Use Topics  . Alcohol use: Yes    Alcohol/week: 2.0 standard drinks    Types: 2 Glasses of wine per week      Objective:   BP 111/63 (BP Location: Right Arm, Patient Position: Sitting, Cuff Size: Normal)   Pulse 80   Temp (!) 97.5 F (36.4 C) (Temporal)   Wt 125 lb 6.4 oz (56.9 kg)   BMI 20.87 kg/m  Vitals:   01/01/19 0908  BP: 111/63  Pulse: 80  Temp: (!) 97.5 F (36.4 C)  TempSrc: Temporal  Weight: 125 lb 6.4 oz (56.9 kg)  Body mass index is 20.87 kg/m.   Physical Exam Vitals  signs reviewed.  Constitutional:      General: She is not in acute distress.    Appearance: Normal appearance. She is well-developed. She is not diaphoretic.  HENT:     Head: Normocephalic and atraumatic.  Eyes:     General: No scleral icterus.    Conjunctiva/sclera: Conjunctivae normal.  Neck:     Musculoskeletal: Neck supple.     Thyroid: No thyromegaly.  Cardiovascular:     Rate and Rhythm: Normal rate and regular rhythm.     Heart sounds: Normal heart sounds. No murmur.  Pulmonary:     Effort: Pulmonary effort is normal. No respiratory distress.     Breath sounds: Normal breath sounds. No wheezing or rhonchi.  Abdominal:     General: There is no distension.     Palpations: Abdomen is soft.     Tenderness: There is no abdominal tenderness.  Musculoskeletal:     Right lower leg: No edema.     Left lower leg: No edema.  Lymphadenopathy:     Cervical: No cervical adenopathy.  Skin:    General: Skin is warm and dry.     Capillary Refill: Capillary refill takes less than 2 seconds.     Comments: Diffuse facial acne. Mixed inflammatory  Neurological:     Mental Status: She is alert and oriented to  person, place, and time. Mental status is at baseline.  Psychiatric:        Mood and Affect: Mood normal.        Behavior: Behavior normal.      No results found for any visits on 01/01/19.     Assessment & Plan   Problem List Items Addressed This Visit      Musculoskeletal and Integument   Acne vulgaris - Primary    Uncontrolled Helped by OCPs, but continues to have breakouts Reports having tried multiple topcals nad PO abx Would like to consider Accutane - discussed that Derm will make this decision whether she is a good candidate Will refer to Derm for further eval and treatment      Relevant Orders   Ambulatory referral to Dermatology       Return if symptoms worsen or fail to improve.   The entirety of the information documented in the History of Present  Illness, Review of Systems and Physical Exam were personally obtained by me. Portions of this information were initially documented by [facility] McClurkin, CMA and reviewed by me for thoroughness and accuracy.    Anola Mcgough, Marzella SchleinAngela M, MD MPH [facility]

## 2019-01-01 NOTE — Patient Instructions (Signed)
Acne  Acne is a skin problem that causes pimples and other skin changes. The skin has many tiny openings called pores. Each pore contains an oil gland. Oil glands make an oily substance that is called sebum. Acne occurs when the pores in the skin get blocked. The pores may become infected with bacteria, or they may become red, sore, and swollen. Acne is a common skin problem, especially for teenagers. It often occurs on the face, neck, chest, upper arms, and back. Acne usually goes away over time. What are the causes? Acne is caused when oil glands get blocked with sebum, dead skin cells, and dirt. The bacteria that are normally found in the oil glands then multiply and cause inflammation. Acne is commonly triggered by changes in your hormones. These hormonal changes can cause the oil glands to get bigger and to make more sebum. Factors that can make acne worse include:  Hormone changes during: ? Adolescence. ? Women's menstrual cycles. ? Pregnancy.  Oil-based cosmetics and hair products.  Stress.  Hormone problems that are caused by certain diseases.  Certain medicines.  Pressure from headbands, backpacks, or shoulder pads.  Exposure to certain oils and chemicals.  Eating a diet high in carbohydrates that quickly turn to sugar. These include dairy products, desserts, and chocolates. What increases the risk? This condition is more likely to develop in:  Teenagers.  People who have a family history of acne. What are the signs or symptoms? Symptoms include:  Small, red bumps (pimples or papules).  Whiteheads.  Blackheads.  Small, pus-filled pimples (pustules).  Big, red pimples or pustules that feel tender. More severe acne can cause:  An abscess. This is an infected area that contains a collection of pus.  Cysts. These are hard, painful, fluid-filled sacs.  Scars. These can happen after large pimples heal. How is this diagnosed? This condition is diagnosed with a  medical history and physical exam. Blood tests may also be done. How is this treated? Treatment for this condition can vary depending on the severity of your acne. Treatment may include:  Creams and lotions that prevent oil glands from clogging.  Creams and lotions that treat or prevent infections and inflammation.  Antibiotic medicines that are applied to the skin or taken as a pill.  Pills that decrease sebum production.  Birth control pills.  Light or laser treatments.  Injections of medicine into the affected areas.  Chemicals that cause peeling of the skin.  Surgery. Your health care provider will also recommend the best way to take care of your skin. Good skin care is the most important part of treatment. Follow these instructions at home: Skin care Take care of your skin as told by your health care provider. You may be told to do these things:  Wash your skin gently at least two times each day, as well as: ? After you exercise. ? Before you go to bed.  Use mild soap.  Apply a water-based skin moisturizer after you wash your skin.  Use a sunscreen or sunblock with SPF 30 or greater. This is especially important if you are using acne medicines.  Choose cosmetics that will not block your oil glands (are noncomedogenic). Medicines  Take over-the-counter and prescription medicines only as told by your health care provider.  If you were prescribed an antibiotic medicine, apply it or take it as told by your health care provider. Do not stop using the antibiotic even if your condition improves. General instructions  Keep your   hair clean and off your face. If you have oily hair, shampoo your hair regularly or daily.  Avoid wearing tight headbands or hats.  Avoid picking or squeezing your pimples. That can make your acne worse and cause scarring.  Shave gently and only when necessary.  Keep a food journal to figure out if any foods are linked to your acne. Avoid dairy  products, desserts, and chocolates.  Take steps to manage and reduce stress.  Keep all follow-up visits as told by your health care provider. This is important. Contact a health care provider if:  Your acne is not better after eight weeks.  Your acne gets worse.  You have a large area of skin that is red or tender.  You think that you are having side effects from any acne medicine. Summary  Acne is a skin problem that causes pimples and other skin changes. Acne is a common skin problem, especially for teenagers. Acne usually goes away over time.  Acne is commonly triggered by changes in your hormones. There are many other causes, such as stress, diet, and certain medicines.  Follow your health care provider's instructions for how to take care of your skin. Good skin care is the most important part of treatment.  Take over-the-counter and prescription medicines only as told by your health care provider.  Contact your health care provider if you think that you are having side effects from any acne medicine. This information is not intended to replace advice given to you by your health care provider. Make sure you discuss any questions you have with your health care provider. Document Released: 04/06/2000 Document Revised: 08/20/2017 Document Reviewed: 08/20/2017 Elsevier Patient Education  2020 Elsevier Inc.  

## 2019-01-01 NOTE — Assessment & Plan Note (Signed)
Uncontrolled Helped by OCPs, but continues to have breakouts Reports having tried multiple topcals nad PO abx Would like to consider Accutane - discussed that Derm will make this decision whether she is a good candidate Will refer to Derm for further eval and treatment

## 2019-02-13 ENCOUNTER — Telehealth: Payer: BC Managed Care – PPO

## 2019-02-13 ENCOUNTER — Other Ambulatory Visit: Payer: Self-pay

## 2019-02-13 ENCOUNTER — Encounter: Payer: Self-pay | Admitting: Family Medicine

## 2019-02-13 ENCOUNTER — Encounter (HOSPITAL_COMMUNITY): Payer: Self-pay

## 2019-02-13 ENCOUNTER — Ambulatory Visit (INDEPENDENT_AMBULATORY_CARE_PROVIDER_SITE_OTHER): Payer: BC Managed Care – PPO | Admitting: Family Medicine

## 2019-02-13 VITALS — BP 110/72 | HR 74 | Temp 97.3°F | Resp 16 | Ht 65.0 in | Wt 126.0 lb

## 2019-02-13 DIAGNOSIS — R1032 Left lower quadrant pain: Secondary | ICD-10-CM | POA: Diagnosis not present

## 2019-02-13 LAB — POCT URINE PREGNANCY: Preg Test, Ur: NEGATIVE

## 2019-02-13 LAB — POCT URINALYSIS DIPSTICK
Appearance: ABNORMAL
Bilirubin, UA: NEGATIVE
Blood, UA: NEGATIVE
Glucose, UA: NEGATIVE
Ketones, UA: NEGATIVE
Leukocytes, UA: NEGATIVE
Nitrite, UA: NEGATIVE
Odor: NORMAL
Protein, UA: POSITIVE — AB
Spec Grav, UA: 1.02 (ref 1.010–1.025)
Urobilinogen, UA: 0.2 E.U./dL
pH, UA: 6 (ref 5.0–8.0)

## 2019-02-13 NOTE — Progress Notes (Signed)
Patient: Darlene Ho Female    DOB: May 30, 1995   23 y.o.   MRN: [id number] Visit Date: 02/13/2019  Today's Provider: Vernie Murders, PA   Chief Complaint  Patient presents with  . Flank Pain   Subjective:     Flank Pain This is a new problem. The current episode started 1 to 4 weeks ago (2 weeks). The problem occurs intermittently. The pain is present in the lumbar spine. The quality of the pain is described as aching and cramping. Radiates to: around her lower back. The pain is at a severity of 5/10. The pain is moderate. Pertinent negatives include no abdominal pain, bladder incontinence, bowel incontinence, chest pain, dysuria, fever, headaches, leg pain, numbness, paresis, paresthesias, pelvic pain, perianal numbness, tingling, weakness or weight loss. She has tried nothing for the symptoms.   Patient states she has flank pain for 2 weeks. Patient states pain is intermittent and moderate. pain radiates around her lower back. No fever, no urinary symptoms. Patient has not tried any treatment at home.  Past Medical History:  Diagnosis Date  . Asthma    Past Surgical History:  Procedure Laterality Date  . COSMETIC SURGERY  2015   nose   . TONSILLECTOMY  2003   Family History  Problem Relation Age of Onset  . Diabetes Mother        type 2  . Breast cancer Maternal Grandmother   . Diabetes Mellitus II Paternal Grandfather   . Obesity Father   . Gout Father   . Alzheimer's disease Maternal Grandfather        late-onset  . Colon cancer Neg Hx   . Ovarian cancer Neg Hx    No Known Allergies  Current Outpatient Medications:  .  Norethindrone Acetate-Ethinyl Estradiol (JUNEL 1.5/30) 1.5-30 MG-MCG tablet, Take 1 tablet by mouth daily. Take active pills continuously for 3 months, Disp: 4 Package, Rfl: 5  Review of Systems  Constitutional: Negative for appetite change, chills, fatigue, fever and weight loss.  Respiratory: Negative for chest tightness and  shortness of breath.   Cardiovascular: Negative for chest pain and palpitations.  Gastrointestinal: Negative for abdominal pain, bowel incontinence, nausea and vomiting.  Genitourinary: Positive for flank pain. Negative for bladder incontinence, dysuria and pelvic pain.  Neurological: Negative for dizziness, tingling, weakness, numbness, headaches and paresthesias.   Social History   Tobacco Use  . Smoking status: Never Smoker  . Smokeless tobacco: Never Used  Substance Use Topics  . Alcohol use: Yes    Alcohol/week: 2.0 standard drinks    Types: 2 Glasses of wine per week     Objective:   BP 110/72 (BP Location: Right Arm, Patient Position: Sitting, Cuff Size: Large)   Pulse 74   Temp (!) 97.3 F (36.3 C) (Other (Comment))   Resp 16   Ht 5\' 5"  (1.651 m)   Wt 126 lb (57.2 kg)   BMI 20.97 kg/m  Vitals:   02/13/19 0841  BP: 110/72  Pulse: 74  Resp: 16  Temp: (!) 97.3 F (36.3 C)  TempSrc: Other (Comment)  Weight: 126 lb (57.2 kg)  Height: 5\' 5"  (1.651 m)  Body mass index is 20.97 kg/m.   Physical Exam Constitutional:      General: She is not in acute distress.    Appearance: She is well-developed.  HENT:     Head: Normocephalic and atraumatic.     Right Ear: Hearing and tympanic membrane normal.  Left Ear: Hearing and tympanic membrane normal.     Nose: Nose normal.  Eyes:     General: Lids are normal. No scleral icterus.       Right eye: No discharge.        Left eye: No discharge.     Conjunctiva/sclera: Conjunctivae normal.  Neck:     Musculoskeletal: Normal range of motion and neck supple.  Cardiovascular:     Rate and Rhythm: Normal rate and regular rhythm.     Heart sounds: Normal heart sounds.  Pulmonary:     Effort: Pulmonary effort is normal. No respiratory distress.     Breath sounds: Normal breath sounds.  Abdominal:     General: Bowel sounds are normal.     Palpations: Abdomen is soft.     Comments: Minimal soreness in the left lower  quadrant. No masses or organomegaly.   Musculoskeletal: Normal range of motion.  Skin:    Findings: No lesion or rash.  Neurological:     Mental Status: She is alert and oriented to person, place, and time.  Psychiatric:        Speech: Speech normal.        Behavior: Behavior normal.        Thought Content: Thought content normal.      Assessment & Plan    1. LLQ discomfort Mild left lower abdomen soreness intermittently the past few days. Presently on BCP that she uses for 3 months and due for menses in the next week. Discomfort lasts approximately for 10 minutes without vaginal discharge, GI upset or dysuria. No pain at the present. Urinalysis essentially normal and pregnancy test negative. Suspect early menstrual cramping. May use Advil or Aleve prn with moist heat applications. - POCT urinalysis dipstick - POCT urine pregnancy      Dortha Kern, PA  [facility].

## 2019-08-26 ENCOUNTER — Other Ambulatory Visit: Payer: Self-pay | Admitting: Family Medicine

## 2019-08-26 NOTE — Telephone Encounter (Signed)
Requested Prescriptions  Pending Prescriptions Disp Refills  . JUNEL 1.5/30 1.5-30 MG-MCG tablet [Pharmacy Med Name: JUNEL 1.5 MG-30 MCG TABLET] 84 tablet 5    Sig: TAKE 1 TABLET BY MOUTH DAILY. TAKE ACTIVE PILLS CONTINUOUSLY FOR 3 MONTHS     OB/GYN:  Contraceptives Passed - 08/26/2019  9:58 AM      Passed - Last BP in normal range    BP Readings from Last 1 Encounters:  02/13/19 110/72         Passed - Valid encounter within last 12 months    Recent Outpatient Visits          6 months ago LLQ discomfort   [facility], [place], [place]   7 months ago Acne vulgaris   [facility], Marzella Schlein, [place]   8 months ago Annual physical exam   [facility], Marzella Schlein, [place]   1 year ago Irregular menses   [facility], Marzella Schlein, [place]

## 2019-11-09 ENCOUNTER — Telehealth: Payer: Self-pay

## 2019-11-09 MED ORDER — NORETHINDRONE ACET-ETHINYL EST 1.5-30 MG-MCG PO TABS
ORAL_TABLET | ORAL | 0 refills | Status: DC
Start: 2019-11-09 — End: 2020-01-30

## 2019-11-09 NOTE — Telephone Encounter (Signed)
Copied from CRM [id number]. Topic: General - Inquiry >> Nov 09, 2019  2:44 PM Floria Raveling A wrote: Reason for CRM: pt called and wanted to see if she can get the JUNEL 1.5/30 1.5-30 MG-MCG tablet [[id number]]  transferred  .  She has moved and has not yet found a new primary care.  She would like to see if we can transfer it , she knows she has more refill but needs it transfer to the [facility] pharmacy  .  Pt needs this my thurs because she will be going out of town .  [facility]  [address]  [place] [address] Phone number [phone]

## 2020-01-30 ENCOUNTER — Other Ambulatory Visit: Payer: Self-pay | Admitting: Physician Assistant
